# Patient Record
Sex: Female | Born: 1975 | Hispanic: No | Marital: Single | Smoking: Current every day smoker
Health system: Southern US, Community
[De-identification: ages and names within clinical notes are randomized; demographics above are authoritative.]

## PROBLEM LIST (undated history)

## (undated) ENCOUNTER — Emergency Department (HOSPITAL_COMMUNITY): Discharge status: Absent without leave | Payer: Self-pay | Source: Home / Self Care

## (undated) DIAGNOSIS — E079 Disorder of thyroid, unspecified: Secondary | ICD-10-CM

## (undated) DIAGNOSIS — F32A Depression, unspecified: Secondary | ICD-10-CM

## (undated) DIAGNOSIS — F329 Major depressive disorder, single episode, unspecified: Secondary | ICD-10-CM

## (undated) DIAGNOSIS — Z9884 Bariatric surgery status: Secondary | ICD-10-CM

## (undated) HISTORY — PX: TONSILLECTOMY: SUR1361

## (undated) HISTORY — PX: GASTRIC BYPASS: SHX52

---

## 2006-11-24 ENCOUNTER — Emergency Department (HOSPITAL_COMMUNITY): Admission: EM | Admit: 2006-11-24 | Discharge: 2006-11-24 | Payer: Self-pay | Admitting: Family Medicine

## 2007-04-29 ENCOUNTER — Emergency Department (HOSPITAL_COMMUNITY): Admission: EM | Admit: 2007-04-29 | Discharge: 2007-04-29 | Payer: Self-pay | Admitting: Emergency Medicine

## 2007-05-19 ENCOUNTER — Emergency Department (HOSPITAL_COMMUNITY): Admission: EM | Admit: 2007-05-19 | Discharge: 2007-05-19 | Payer: Self-pay | Admitting: Emergency Medicine

## 2008-05-21 ENCOUNTER — Emergency Department (HOSPITAL_COMMUNITY): Admission: EM | Admit: 2008-05-21 | Discharge: 2008-05-21 | Payer: Self-pay | Admitting: Family Medicine

## 2008-05-23 ENCOUNTER — Emergency Department (HOSPITAL_COMMUNITY): Admission: EM | Admit: 2008-05-23 | Discharge: 2008-05-23 | Payer: Self-pay | Admitting: Emergency Medicine

## 2009-02-12 ENCOUNTER — Emergency Department (HOSPITAL_COMMUNITY): Admission: EM | Admit: 2009-02-12 | Discharge: 2009-02-12 | Payer: Self-pay | Admitting: Family Medicine

## 2009-05-14 ENCOUNTER — Emergency Department (HOSPITAL_COMMUNITY): Admission: EM | Admit: 2009-05-14 | Discharge: 2009-05-14 | Payer: Self-pay | Admitting: Pediatric Allergy/Immunology

## 2010-08-10 ENCOUNTER — Emergency Department (HOSPITAL_COMMUNITY): Admission: EM | Admit: 2010-08-10 | Discharge: 2010-08-10 | Payer: Self-pay | Admitting: Emergency Medicine

## 2010-08-25 ENCOUNTER — Emergency Department (HOSPITAL_COMMUNITY): Admission: EM | Admit: 2010-08-25 | Discharge: 2010-08-25 | Payer: Self-pay | Admitting: Emergency Medicine

## 2010-11-21 ENCOUNTER — Emergency Department (HOSPITAL_COMMUNITY): Admission: EM | Admit: 2010-11-21 | Discharge: 2010-08-21 | Payer: Self-pay | Admitting: Emergency Medicine

## 2010-12-27 ENCOUNTER — Emergency Department (HOSPITAL_COMMUNITY)
Admission: EM | Admit: 2010-12-27 | Discharge: 2010-12-27 | Payer: Self-pay | Source: Home / Self Care | Admitting: Emergency Medicine

## 2010-12-30 LAB — CBC
HCT: 39.1 % (ref 36.0–46.0)
Hemoglobin: 13.5 g/dL (ref 12.0–15.0)
MCH: 32.4 pg (ref 26.0–34.0)
MCHC: 34.5 g/dL (ref 30.0–36.0)
MCV: 93.8 fL (ref 78.0–100.0)
Platelets: 333 10*3/uL (ref 150–400)
RBC: 4.17 MIL/uL (ref 3.87–5.11)
RDW: 12.4 % (ref 11.5–15.5)
WBC: 6.2 10*3/uL (ref 4.0–10.5)

## 2010-12-30 LAB — DIFFERENTIAL
Basophils Absolute: 0 10*3/uL (ref 0.0–0.1)
Basophils Relative: 1 % (ref 0–1)
Eosinophils Absolute: 0.1 10*3/uL (ref 0.0–0.7)
Eosinophils Relative: 1 % (ref 0–5)
Lymphocytes Relative: 30 % (ref 12–46)
Lymphs Abs: 1.9 10*3/uL (ref 0.7–4.0)
Monocytes Absolute: 0.3 10*3/uL (ref 0.1–1.0)
Monocytes Relative: 5 % (ref 3–12)
Neutro Abs: 3.9 10*3/uL (ref 1.7–7.7)
Neutrophils Relative %: 63 % (ref 43–77)

## 2010-12-30 LAB — COMPREHENSIVE METABOLIC PANEL
ALT: 16 U/L (ref 0–35)
AST: 24 U/L (ref 0–37)
Albumin: 4.3 g/dL (ref 3.5–5.2)
Alkaline Phosphatase: 68 U/L (ref 39–117)
BUN: 5 mg/dL — ABNORMAL LOW (ref 6–23)
CO2: 18 mEq/L — ABNORMAL LOW (ref 19–32)
Calcium: 9.6 mg/dL (ref 8.4–10.5)
Chloride: 113 mEq/L — ABNORMAL HIGH (ref 96–112)
Creatinine, Ser: 0.86 mg/dL (ref 0.4–1.2)
GFR calc Af Amer: >60 mL/min (ref >60)
GFR calc non Af Amer: >60 mL/min (ref >60)
Glucose, Bld: 118 mg/dL — ABNORMAL HIGH (ref 70–99)
Potassium: 3.8 mEq/L (ref 3.5–5.1)
Sodium: 139 mEq/L (ref 135–145)
Total Bilirubin: 1.7 mg/dL — ABNORMAL HIGH (ref 0.3–1.2)
Total Protein: 7.1 g/dL (ref 6.0–8.3)

## 2010-12-30 LAB — URINALYSIS, ROUTINE W REFLEX MICROSCOPIC
Ketones, ur: 15 mg/dL — AB
Nitrite: NEGATIVE
Protein, ur: 100 mg/dL — AB
Specific Gravity, Urine: >1.046 — ABNORMAL HIGH (ref 1.005–1.030)
Urine Glucose, Fasting: NEGATIVE mg/dL
Urobilinogen, UA: 0.2 mg/dL (ref 0.0–1.0)
pH: 5.5 (ref 5.0–8.0)

## 2010-12-30 LAB — URINE MICROSCOPIC-ADD ON

## 2010-12-30 LAB — LIPASE, BLOOD: Lipase: 20 U/L (ref 11–59)

## 2011-01-20 ENCOUNTER — Emergency Department (HOSPITAL_COMMUNITY)
Admission: EM | Admit: 2011-01-20 | Discharge: 2011-01-20 | Disposition: A | Discharge status: Home / Self Care | Payer: Self-pay | Attending: Emergency Medicine | Admitting: Emergency Medicine

## 2011-01-20 DIAGNOSIS — H109 Unspecified conjunctivitis: Secondary | ICD-10-CM | POA: Insufficient documentation

## 2011-02-27 LAB — CBC
HCT: 39.2 % (ref 36.0–46.0)
HCT: 42.1 % (ref 36.0–46.0)
Hemoglobin: 13.5 g/dL (ref 12.0–15.0)
Hemoglobin: 14.8 g/dL (ref 12.0–15.0)
MCH: 31.8 pg (ref 26.0–34.0)
MCHC: 34.5 g/dL (ref 30.0–36.0)
MCHC: 35.1 g/dL (ref 30.0–36.0)
MCV: 92.1 fL (ref 78.0–100.0)
Platelets: 255 10*3/uL (ref 150–400)
RBC: 4.25 MIL/uL (ref 3.87–5.11)
RDW: 14.1 % (ref 11.5–15.5)
WBC: 9.1 10*3/uL (ref 4.0–10.5)

## 2011-02-27 LAB — URINALYSIS, ROUTINE W REFLEX MICROSCOPIC
Bilirubin Urine: NEGATIVE
Glucose, UA: NEGATIVE mg/dL
Leukocytes, UA: NEGATIVE
Nitrite: NEGATIVE
Protein, ur: NEGATIVE mg/dL
Specific Gravity, Urine: 1.034 — ABNORMAL HIGH (ref 1.005–1.030)
Urobilinogen, UA: 0.2 mg/dL (ref 0.0–1.0)
pH: 6 (ref 5.0–8.0)

## 2011-02-27 LAB — DIFFERENTIAL
Basophils Absolute: 0 10*3/uL (ref 0.0–0.1)
Basophils Relative: 0 % (ref 0–1)
Basophils Relative: 1 % (ref 0–1)
Eosinophils Absolute: 0.4 10*3/uL (ref 0.0–0.7)
Eosinophils Relative: 4 % (ref 0–5)
Lymphocytes Relative: 44 % (ref 12–46)
Lymphs Abs: 4 10*3/uL (ref 0.7–4.0)
Monocytes Absolute: 0.5 10*3/uL (ref 0.1–1.0)
Monocytes Absolute: 0.7 10*3/uL (ref 0.1–1.0)
Monocytes Relative: 7 % (ref 3–12)
Monocytes Relative: 7 % (ref 3–12)
Neutro Abs: 3.3 10*3/uL (ref 1.7–7.7)
Neutro Abs: 4 10*3/uL (ref 1.7–7.7)
Neutrophils Relative %: 45 % (ref 43–77)

## 2011-02-27 LAB — COMPREHENSIVE METABOLIC PANEL
ALT: 19 U/L (ref 0–35)
AST: 23 U/L (ref 0–37)
Albumin: 4.2 g/dL (ref 3.5–5.2)
Alkaline Phosphatase: 80 U/L (ref 39–117)
BUN: 10 mg/dL (ref 6–23)
CO2: 24 mEq/L (ref 19–32)
Calcium: 9 mg/dL (ref 8.4–10.5)
Chloride: 110 mEq/L (ref 96–112)
Creatinine, Ser: 0.86 mg/dL (ref 0.4–1.2)
GFR calc Af Amer: >60 mL/min (ref >60)
GFR calc non Af Amer: >60 mL/min (ref >60)
Glucose, Bld: 197 mg/dL — ABNORMAL HIGH (ref 70–99)
Potassium: 3.7 mEq/L (ref 3.5–5.1)
Sodium: 139 mEq/L (ref 135–145)
Total Bilirubin: 1 mg/dL (ref 0.3–1.2)
Total Protein: 6.6 g/dL (ref 6.0–8.3)

## 2011-02-27 LAB — URINE MICROSCOPIC-ADD ON

## 2011-02-27 LAB — BASIC METABOLIC PANEL
BUN: 9 mg/dL (ref 6–23)
CO2: 24 mEq/L (ref 19–32)
Calcium: 9.7 mg/dL (ref 8.4–10.5)
Chloride: 107 mEq/L (ref 96–112)
Creatinine, Ser: 0.91 mg/dL (ref 0.4–1.2)
GFR calc Af Amer: >60 mL/min (ref >60)
GFR calc non Af Amer: >60 mL/min (ref >60)
Glucose, Bld: 120 mg/dL — ABNORMAL HIGH (ref 70–99)
Potassium: 3.5 mEq/L (ref 3.5–5.1)
Sodium: 138 mEq/L (ref 135–145)

## 2011-02-27 LAB — POCT CARDIAC MARKERS
CKMB, poc: <1 ng/mL — ABNORMAL LOW (ref 1.0–8.0)
Myoglobin, poc: 40.1 ng/mL (ref 12–200)
Troponin i, poc: <0.05 ng/mL (ref 0.00–0.09)

## 2011-02-27 LAB — LIPASE, BLOOD: Lipase: 45 U/L (ref 11–59)

## 2011-02-27 LAB — POCT PREGNANCY, URINE: Preg Test, Ur: NEGATIVE

## 2011-02-28 LAB — DIFFERENTIAL
Lymphocytes Relative: 37 % (ref 12–46)
Lymphs Abs: 3.7 10*3/uL (ref 0.7–4.0)
Neutro Abs: 5.3 10*3/uL (ref 1.7–7.7)
Neutrophils Relative %: 53 % (ref 43–77)

## 2011-02-28 LAB — CBC
MCV: 91.7 fL (ref 78.0–100.0)
Platelets: 278 10*3/uL (ref 150–400)
RBC: 4.4 MIL/uL (ref 3.87–5.11)
WBC: 10 10*3/uL (ref 4.0–10.5)

## 2011-02-28 LAB — POCT I-STAT, CHEM 8
BUN: 6 mg/dL (ref 6–23)
Chloride: 107 mEq/L (ref 96–112)
Creatinine, Ser: 1.1 mg/dL (ref 0.4–1.2)
Potassium: 3.1 mEq/L — ABNORMAL LOW (ref 3.5–5.1)
Sodium: 141 mEq/L (ref 135–145)

## 2011-03-23 ENCOUNTER — Inpatient Hospital Stay (HOSPITAL_COMMUNITY)
Admission: EM | Admit: 2011-03-23 | Discharge: 2011-03-27 | DRG: 918 | Disposition: A | Discharge status: Home / Self Care | Payer: Self-pay | Attending: Internal Medicine | Admitting: Internal Medicine

## 2011-03-23 DIAGNOSIS — F1010 Alcohol abuse, uncomplicated: Secondary | ICD-10-CM | POA: Diagnosis present

## 2011-03-23 DIAGNOSIS — Z9884 Bariatric surgery status: Secondary | ICD-10-CM

## 2011-03-23 DIAGNOSIS — Y92009 Unspecified place in unspecified non-institutional (private) residence as the place of occurrence of the external cause: Secondary | ICD-10-CM

## 2011-03-23 DIAGNOSIS — T391X1A Poisoning by 4-Aminophenol derivatives, accidental (unintentional), initial encounter: Principal | ICD-10-CM | POA: Diagnosis present

## 2011-03-23 DIAGNOSIS — R112 Nausea with vomiting, unspecified: Secondary | ICD-10-CM | POA: Diagnosis present

## 2011-03-23 DIAGNOSIS — E871 Hypo-osmolality and hyponatremia: Secondary | ICD-10-CM | POA: Diagnosis present

## 2011-03-23 DIAGNOSIS — R197 Diarrhea, unspecified: Secondary | ICD-10-CM | POA: Diagnosis present

## 2011-03-23 DIAGNOSIS — F17200 Nicotine dependence, unspecified, uncomplicated: Secondary | ICD-10-CM | POA: Diagnosis present

## 2011-03-23 DIAGNOSIS — F1410 Cocaine abuse, uncomplicated: Secondary | ICD-10-CM | POA: Diagnosis present

## 2011-03-24 ENCOUNTER — Emergency Department (HOSPITAL_COMMUNITY): Payer: Self-pay

## 2011-03-24 LAB — COMPREHENSIVE METABOLIC PANEL
ALT: 2454 U/L — ABNORMAL HIGH (ref 0–35)
ALT: 1821 U/L — ABNORMAL HIGH (ref 0–35)
AST: 3398 U/L — ABNORMAL HIGH (ref 0–37)
Albumin: 4.1 g/dL (ref 3.5–5.2)
Alkaline Phosphatase: 64 U/L (ref 39–117)
BUN: 5 mg/dL — ABNORMAL LOW (ref 6–23)
CO2: 20 mEq/L (ref 19–32)
CO2: 22 mEq/L (ref 19–32)
Calcium: 8.8 mg/dL (ref 8.4–10.5)
Chloride: 108 mEq/L (ref 96–112)
Creatinine, Ser: 0.71 mg/dL (ref 0.4–1.2)
GFR calc Af Amer: >60 mL/min (ref >60)
GFR calc non Af Amer: >60 mL/min (ref >60)
Glucose, Bld: 106 mg/dL — ABNORMAL HIGH (ref 70–99)
Potassium: 4.1 mEq/L (ref 3.5–5.1)
Sodium: 133 mEq/L — ABNORMAL LOW (ref 135–145)
Sodium: 135 mEq/L (ref 135–145)
Total Bilirubin: 1 mg/dL (ref 0.3–1.2)
Total Protein: 6.9 g/dL (ref 6.0–8.3)
Total Protein: 6.6 g/dL (ref 6.0–8.3)

## 2011-03-24 LAB — DIFFERENTIAL
Basophils Absolute: 0 10*3/uL (ref 0.0–0.1)
Basophils Relative: 1 % (ref 0–1)
Eosinophils Absolute: 0.2 10*3/uL (ref 0.0–0.7)
Monocytes Absolute: 0.2 10*3/uL (ref 0.1–1.0)
Neutro Abs: 4.3 10*3/uL (ref 1.7–7.7)
Neutrophils Relative %: 74 % (ref 43–77)

## 2011-03-24 LAB — URINE MICROSCOPIC-ADD ON

## 2011-03-24 LAB — URINALYSIS, ROUTINE W REFLEX MICROSCOPIC
Glucose, UA: NEGATIVE mg/dL
Hgb urine dipstick: NEGATIVE
Leukocytes, UA: NEGATIVE
Specific Gravity, Urine: >1.046 — ABNORMAL HIGH (ref 1.005–1.030)
Urobilinogen, UA: 1 mg/dL (ref 0.0–1.0)

## 2011-03-24 LAB — PROTIME-INR
INR: 0.96 (ref 0.00–1.49)
INR: 0.95 (ref 0.00–1.49)
Prothrombin Time: 13 seconds (ref 11.6–15.2)
Prothrombin Time: 12.9 seconds (ref 11.6–15.2)

## 2011-03-24 LAB — RAPID URINE DRUG SCREEN, HOSP PERFORMED
Barbiturates: NOT DETECTED
Opiates: NOT DETECTED

## 2011-03-24 LAB — CBC
Hemoglobin: 14.6 g/dL (ref 12.0–15.0)
MCHC: 36 g/dL (ref 30.0–36.0)
Platelets: 246 10*3/uL (ref 150–400)

## 2011-03-24 LAB — ACETAMINOPHEN LEVEL: Acetaminophen (Tylenol), Serum: <10 ug/mL — ABNORMAL LOW (ref 10–30)

## 2011-03-24 LAB — SALICYLATE LEVEL: Salicylate Lvl: <4 mg/dL (ref 2.8–20.0)

## 2011-03-25 LAB — LIPASE, BLOOD: Lipase: 27 U/L (ref 11–59)

## 2011-03-25 LAB — COMPREHENSIVE METABOLIC PANEL
ALT: 1154 U/L — ABNORMAL HIGH (ref 0–35)
AST: 406 U/L — ABNORMAL HIGH (ref 0–37)
BUN: 3 mg/dL — ABNORMAL LOW (ref 6–23)
CO2: 25 mEq/L (ref 19–32)
CO2: 27 mEq/L (ref 19–32)
CO2: 24 mEq/L (ref 19–32)
Calcium: 8.7 mg/dL (ref 8.4–10.5)
Calcium: 9 mg/dL (ref 8.4–10.5)
Calcium: 8.5 mg/dL (ref 8.4–10.5)
Chloride: 108 mEq/L (ref 96–112)
Chloride: 109 mEq/L (ref 96–112)
Creatinine, Ser: 0.66 mg/dL (ref 0.4–1.2)
Creatinine, Ser: 0.59 mg/dL (ref 0.4–1.2)
Creatinine, Ser: 0.6 mg/dL (ref 0.4–1.2)
GFR calc Af Amer: >60 mL/min (ref >60)
GFR calc non Af Amer: >60 mL/min (ref >60)
GFR calc non Af Amer: >60 mL/min (ref >60)
GFR calc non Af Amer: >60 mL/min (ref >60)
Glucose, Bld: 135 mg/dL — ABNORMAL HIGH (ref 70–99)
Glucose, Bld: 87 mg/dL (ref 70–99)
Glucose, Bld: 81 mg/dL (ref 70–99)
Sodium: 141 mEq/L (ref 135–145)
Total Bilirubin: 0.7 mg/dL (ref 0.3–1.2)
Total Bilirubin: 0.6 mg/dL (ref 0.3–1.2)
Total Protein: 6.1 g/dL (ref 6.0–8.3)

## 2011-03-25 LAB — URINALYSIS, MICROSCOPIC ONLY
Bilirubin Urine: NEGATIVE
Hgb urine dipstick: NEGATIVE
Ketones, ur: NEGATIVE mg/dL
Nitrite: NEGATIVE
Specific Gravity, Urine: 1.011 (ref 1.005–1.030)
Urobilinogen, UA: 1 mg/dL (ref 0.0–1.0)

## 2011-03-25 LAB — POCT I-STAT, CHEM 8
BUN: 7 mg/dL (ref 6–23)
Calcium, Ion: 1.27 mmol/L (ref 1.12–1.32)
Chloride: 107 mEq/L (ref 96–112)
Glucose, Bld: 102 mg/dL — ABNORMAL HIGH (ref 70–99)
TCO2: 23 mmol/L (ref 0–100)

## 2011-03-25 LAB — RAPID URINE DRUG SCREEN, HOSP PERFORMED
Amphetamines: NOT DETECTED
Barbiturates: NOT DETECTED
Benzodiazepines: NOT DETECTED
Cocaine: NOT DETECTED

## 2011-03-25 LAB — HEPATITIS PANEL, ACUTE
Hep A IgM: NEGATIVE
Hep B C IgM: NEGATIVE

## 2011-03-25 LAB — PROTIME-INR
INR: 0.9 (ref 0.00–1.49)
Prothrombin Time: 12.4 seconds (ref 11.6–15.2)

## 2011-03-26 LAB — COMPREHENSIVE METABOLIC PANEL
AST: 120 U/L — ABNORMAL HIGH (ref 0–37)
Albumin: 3.5 g/dL (ref 3.5–5.2)
BUN: 2 mg/dL — ABNORMAL LOW (ref 6–23)
Calcium: 8.9 mg/dL (ref 8.4–10.5)
Chloride: 103 mEq/L (ref 96–112)
Creatinine, Ser: 0.53 mg/dL (ref 0.4–1.2)
GFR calc Af Amer: >60 mL/min (ref >60)
Total Bilirubin: 0.6 mg/dL (ref 0.3–1.2)

## 2011-03-26 LAB — PROTIME-INR
INR: 0.93 (ref 0.00–1.49)
Prothrombin Time: 12.7 seconds (ref 11.6–15.2)

## 2011-03-26 LAB — ACETAMINOPHEN LEVEL: Acetaminophen (Tylenol), Serum: <10 ug/mL — ABNORMAL LOW (ref 10–30)

## 2011-03-27 LAB — COMPREHENSIVE METABOLIC PANEL
ALT: 561 U/L — ABNORMAL HIGH (ref 0–35)
Albumin: 3.3 g/dL — ABNORMAL LOW (ref 3.5–5.2)
Alkaline Phosphatase: 72 U/L (ref 39–117)
Alkaline Phosphatase: 71 U/L (ref 39–117)
BUN: 4 mg/dL — ABNORMAL LOW (ref 6–23)
Chloride: 104 mEq/L (ref 96–112)
Chloride: 102 mEq/L (ref 96–112)
Creatinine, Ser: 0.58 mg/dL (ref 0.4–1.2)
Glucose, Bld: 83 mg/dL (ref 70–99)
Glucose, Bld: 102 mg/dL — ABNORMAL HIGH (ref 70–99)
Potassium: 4.8 mEq/L (ref 3.5–5.1)
Potassium: 4.1 mEq/L (ref 3.5–5.1)
Sodium: 138 mEq/L (ref 135–145)
Total Bilirubin: 0.5 mg/dL (ref 0.3–1.2)
Total Bilirubin: 0.6 mg/dL (ref 0.3–1.2)
Total Protein: 6.2 g/dL (ref 6.0–8.3)

## 2011-03-27 LAB — GLUCOSE, CAPILLARY: Glucose-Capillary: 96 mg/dL (ref 70–99)

## 2011-03-27 LAB — PROTIME-INR: Prothrombin Time: 12.9 seconds (ref 11.6–15.2)

## 2011-03-28 NOTE — Discharge Summary (Signed)
NAME:  [name], [name]            ACCOUNT NO.:  [id number]  MEDICAL RECORD NO.:  [id number]           PATIENT TYPE:  I  LOCATION:  6714                         FACILITY:  [facility]  PHYSICIAN:  [name], M.D.DATE OF BIRTH:  Jun 16, 1976  DATE OF ADMISSION:  03/23/2011 DATE OF DISCHARGE:  03/27/2011                              DISCHARGE SUMMARY   PRIMARY CARE DOCTOR:  Unassigned.  DISCHARGE DIAGNOSES: 1. Accidental acetaminophen overdose. 2. Diarrhea. 3. Polysubstance abuse. 4. Hyponatremia.  DISCHARGE MEDICATIONS: 1. Protonix 40 mg. 2. Tramadol 50 mg 2 tablets q. 8 hours p.r.n. 3. Ferrous sulfate over the counter p.o. daily.  PROCEDURES PERFORMED:  Abdominal ultrasound that showed mildly distended gallbladder without evidence for obstruction or cholelithiasis, findings compatible with his prior CT mild fatty liver infiltration.  BRIEF ADMITTING HISTORY AND PHYSICAL:  This is a 35 year old female with past medical history of nausea and vomiting and persisting epigastric abdominal pain had been going since Thursday.  She had a bypass Roux-en- Y procedure already.  Every time she eats anything, she has increased epigastric or right upper quadrant pain with nausea.  She has only had 2 episodes of diarrhea, throughout all this process her stool is otherwise normal.  The pain is central, is located in the epigastric area.  She denies any melena or bright blood per rectum.  She was found to have in the ED an elevated LFTs.  Denies any alcohol use, but admits to taking Tylenol very frequently and not intentionally, but try to relieve the pain.  PHYSICAL EXAMINATION:  VITAL SIGNS:  Temperature 98, blood pressure 110/98, pulse 68, respirations of 20, she is saturating 98% on room air. GENERAL:  She is awake, alert, and oriented x3, in no acute distress. HEENT:  Pupils equally round, and reactive to light. NECK:  Supple.  No JVD. LUNGS:  Good air movement.  Clear to  auscultation. CARDIOVASCULAR:  She has regular rate and rhythm with positive S1 and S2.  No murmurs, rubs or gallops. ABDOMEN:  Positive bowel sounds, nontender, nondistended.  Soft. EXTREMITIES:  Positive pulses.  No clubbing, cyanosis or edema.  LABS ON ADMISSION:  Showed UA 1040 of specific gravity, crystal oxalate, rare bacteria.  Her white count is 5.8, hemoglobin of 14.6, platelet count of 246, ANC 4.3, MCV of 94.  Her lipase was 29.  Her urine pregnancy test was negative.  Her sodium was 133, potassium 3.8, chloride 107, bicarb 20, glucose of 111, BUN of 7, creatinine 0.7, bilirubin 1.3, alkaline phosphatase 73, AST 3398, ALT 2458, total protein 6.9, albumin of 4.1, calcium of 8.2.  Acetaminophen level less than 10.  Aspirin less than 4.  Urine drug screen is positive for cocaine.  PT 13.  INR 0.9.  IMAGING:  As above.  ASSESSMENT/PLAN: 1. Accidental acetaminophen overdose.  This was nonintentional, she     was trying to relieve her pain, so she was taking continuous     tylenol.  She had  been drinking alcohol, which may be     contributing to this.  She was started on NAC which she completed a     3-day treatment.  PT and INR and daily acetaminophens were done.     They were also remained, acetaminophen less than 10 and alcohol     less than 5, her PT/INR remained her PT less than 13 and her INR     less than 1.  After 1 day of treatment of mag, her LFTs started to     trend down.  She was feeling much better.  She was on started on     Protonix p.o. b.i.d.  Abdominal pain improved, so she would going     home on Protonix p.o. b.i.d.  Avoid Tylenol.  We will use Tylenol     for pain. 2. Diarrhea, currently resolved.  No further evaluations. 3. Polysubstance abuse.  She was counseled about cocaine and about     alcohol, as this might affect her kidney and liver in the future.     She seems to understand. 4. Hyponatremia.  This probably secondary to dehydration.  She was      started on IV fluids and this resolved.  VITALS ON DAY OF DISCHARGE:  Showed temperature 98, pulse 70, respiration 18, blood pressure 119/82.  She was saturating 98% on room air.  LABS ON DAY OF DISCHARGE:  Showed sodium 137, potassium 4.1, chloride 102, bicarb of 29, glucose of 102, BUN of 4, creatinine 0.5, bilirubin 0.6, alkaline phosphatase 71, AST 61, ALT 503,  total protein 6.3, albumin of 3.9, calcium of 8.9.     [name], M.D.     AF/MEDQ  D:  03/27/2011  T:  03/28/2011  Job:  [id number]  Electronically Signed by Lambert Keto M.D. on 03/28/2011 02:55:47 PM

## 2011-04-01 NOTE — H&P (Signed)
[name], [name]            ACCOUNT NO.:  [id number]  MEDICAL RECORD NO.:  [id number]           PATIENT TYPE:  I  LOCATION:  6714                         FACILITY:  [facility]  PHYSICIAN:  Lonia Blood, M.D.      DATE OF BIRTH:  May 16, 1976  DATE OF ADMISSION:  03/23/2011 DATE OF DISCHARGE:                             HISTORY & PHYSICAL   PRIMARY CARE PHYSICIAN:  She is unassigned to [place].  PRESENTING COMPLAINT:  Nausea, vomiting, abdominal pain.  HISTORY OF PRESENT ILLNESS:  The patient is a 35 year old female that presented to the ED with a couple of days of nausea, vomiting and persistent epigastric abdominal pain.  This has been going on since Thursday.  She has gastric bypass surgery in 2011 over at [place].  She was a roux-en-Y procedure, but lately every time she eats or drinks anything, she has had increased epigastric or right upper quadrant abdominal pain and nausea.  She has had only two episodes of diarrhea through all this process.  Her stools is otherwise normal.  The pain is central, is located in the epigastric region and rated as 6-7/10.  She denied any melena.  No bright red blood per rectum.  No hematemesis. The patient also volunteered information that she has been using about 15 g of Tylenol a day.  This is all effort to control her pain and this has been going on for several days if not almost 2 weeks.  She was found in the ED to have elevated LFTs.  Denied any alcoholism.  Denied taking other drugs.  PAST MEDICAL HISTORY:  None.  PAST SURGICAL HISTORY:  She is status post gastric bypass surgery since November 2011.  ALLERGIES:  She has no known drug allergies.  MEDICATIONS:  Currently none except for the TYLENOL that she takes on and off.  SOCIAL HISTORY:  The patient lives in [place].  She denied any alcohol use.  She smokes about a half pack per day.  She is otherwise normal.  FAMILY HISTORY:  Denied any significant family history.  REVIEW OF  SYSTEMS:  All systems reviewed currently are negative except per HPI.  PHYSICAL EXAMINATION:  VITAL SIGNS:  Her temperature is 98.2, blood pressure 110/95, pulse 68, respiratory rate 20, her sats 98% room air. GENERAL:  She is awake, alert, oriented.  She seems to be mild distress due to pain. HEENT:  PERRL.  EOMI.  No pallor.  No jaundice.  No rhinorrhea. NECK:  Supple.  No JVD.  No lymphadenopathy. RESPIRATORY:  She has good air entry bilaterally.  No wheezes.  No rales.  No crackles. CARDIOVASCULAR:  She has S1, S2.  No murmur. ABDOMEN:  Soft and nontender with positive bowel sounds. EXTREMITIES:  No edema, cyanosis or clubbing. SKIN:  No rashes or ulcers.  LABORATORY DATA:  Her urinalysis showed amber cloudy urine, which is concentrated with pH with urine specific gravity more than 1.046 and mild proteinuria.  Urine microscopy showed calcium oxalate crystals only.  White count 5.8, hemoglobin 14.6 with platelet of 246.  Her sodium is 133, potassium 3.8, chloride 107, CO2 20, glucose 111, BUN 7, creatinine  0.71, calcium 8.8, total protein 6.9.  Her albumin is 4.1. Her AST is 3398, total bilirubin 1.8, alkaline phosphatase 73.  Her ALT is 2454, lipase is 29.  Abdominal ultrasound showed mildly distended gallbladder without evidence for obstruction or cholecystitis.  Findings stable from the prior CT, mild fatty infiltration within the liver.  ASSESSMENT:  This is a 35 year old female presenting with elevated LFTs and epigastric pain.  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  It could also be viral hepatitis.  Denied alcoholism but could be alcoholic hepatitis.  He is not consistent with obstructive jaundice, so I doubt this is gallstone related.  She also has no evidence of cholecystitis.  PLAN: 1. Abnormal liver enzymes.  We will admit the patient and assume     Tylenol-induce injury.   We will treat her for that and follow her     LFTs closely.  We may have to get GI consult as well. 2. Possible Tylenol toxicity.  I will check Tylenol levels, salicylate     levels on the urine drug screen.  We have contacted poison control     and advice is to cautiously start n-acetylcysteine for the first 24     hours and recheck her LFTs after about 23 hours to see if they are     improving.  We will also check PT/INR at this stage. 3. Tobacco abuse.  The patient will get tobacco cessation counseling.     I will offer her nicotine patch as needed. 4. Hyponatremia probably from being mildly dehydrated.  We will give     her some saline and watch closely.     Lonia Blood, M.D.     Verlin Grills  D:  03/24/2011  T:  03/24/2011  Job:  [id number]  Electronically Signed by Lonia Blood M.D. on 04/01/2011 04:14:56 PM

## 2011-05-23 ENCOUNTER — Emergency Department (HOSPITAL_COMMUNITY): Payer: Self-pay

## 2011-05-23 ENCOUNTER — Emergency Department (HOSPITAL_COMMUNITY)
Admission: EM | Admit: 2011-05-23 | Discharge: 2011-05-24 | Disposition: A | Discharge status: Home / Self Care | Payer: Self-pay | Attending: Emergency Medicine | Admitting: Emergency Medicine

## 2011-05-23 DIAGNOSIS — Z9884 Bariatric surgery status: Secondary | ICD-10-CM | POA: Insufficient documentation

## 2011-05-23 DIAGNOSIS — R109 Unspecified abdominal pain: Secondary | ICD-10-CM | POA: Insufficient documentation

## 2011-05-23 HISTORY — DX: Bariatric surgery status: Z98.84

## 2011-05-23 LAB — POCT PREGNANCY, URINE: Preg Test, Ur: NEGATIVE

## 2011-05-23 LAB — CBC
HCT: 39.4 % (ref 36.0–46.0)
Hemoglobin: 14 g/dL (ref 12.0–15.0)
MCHC: 35.5 g/dL (ref 30.0–36.0)

## 2011-05-23 LAB — DIFFERENTIAL
Basophils Absolute: 0.1 10*3/uL (ref 0.0–0.1)
Lymphocytes Relative: 35 % (ref 12–46)
Lymphs Abs: 2.9 10*3/uL (ref 0.7–4.0)
Monocytes Absolute: 0.5 10*3/uL (ref 0.1–1.0)
Monocytes Relative: 6 % (ref 3–12)
Neutro Abs: 4.8 10*3/uL (ref 1.7–7.7)

## 2011-05-23 LAB — COMPREHENSIVE METABOLIC PANEL
Alkaline Phosphatase: 78 U/L (ref 39–117)
BUN: 12 mg/dL (ref 6–23)
GFR calc Af Amer: >60 mL/min (ref >60)
Glucose, Bld: 100 mg/dL — ABNORMAL HIGH (ref 70–99)
Potassium: 4.1 mEq/L (ref 3.5–5.1)
Total Bilirubin: 0.7 mg/dL (ref 0.3–1.2)
Total Protein: 7.3 g/dL (ref 6.0–8.3)

## 2011-05-23 LAB — URINALYSIS, ROUTINE W REFLEX MICROSCOPIC
Bilirubin Urine: NEGATIVE
Ketones, ur: NEGATIVE mg/dL
Nitrite: NEGATIVE
Protein, ur: NEGATIVE mg/dL
Urobilinogen, UA: 0.2 mg/dL (ref 0.0–1.0)

## 2011-05-23 LAB — LIPASE, BLOOD: Lipase: 33 U/L (ref 11–59)

## 2011-05-24 ENCOUNTER — Encounter (HOSPITAL_COMMUNITY): Payer: Self-pay | Admitting: Radiology

## 2011-05-24 LAB — HEPATIC FUNCTION PANEL
AST: 22 U/L (ref 0–37)
Bilirubin, Direct: 0.1 mg/dL (ref 0.0–0.3)
Indirect Bilirubin: 0.5 mg/dL (ref 0.3–0.9)

## 2011-05-24 MED ORDER — IOHEXOL 300 MG/ML  SOLN
100.0000 mL | Freq: Once | INTRAMUSCULAR | Status: AC | PRN
  Administered 2011-05-24: 100 mL via INTRAVENOUS

## 2011-08-18 ENCOUNTER — Emergency Department (HOSPITAL_COMMUNITY): Payer: Self-pay

## 2011-08-18 ENCOUNTER — Emergency Department (HOSPITAL_COMMUNITY)
Admission: EM | Admit: 2011-08-18 | Discharge: 2011-08-18 | Disposition: A | Discharge status: Home / Self Care | Payer: Self-pay | Attending: Emergency Medicine | Admitting: Emergency Medicine

## 2011-08-18 DIAGNOSIS — S6390XA Sprain of unspecified part of unspecified wrist and hand, initial encounter: Secondary | ICD-10-CM | POA: Insufficient documentation

## 2011-08-18 DIAGNOSIS — M25539 Pain in unspecified wrist: Secondary | ICD-10-CM | POA: Insufficient documentation

## 2011-08-18 DIAGNOSIS — IMO0002 Reserved for concepts with insufficient information to code with codable children: Secondary | ICD-10-CM | POA: Insufficient documentation

## 2011-09-11 LAB — POCT RAPID STREP A: Streptococcus, Group A Screen (Direct): POSITIVE — AB

## 2012-02-26 ENCOUNTER — Encounter (HOSPITAL_COMMUNITY): Payer: Self-pay | Admitting: *Deleted

## 2012-02-26 ENCOUNTER — Emergency Department (INDEPENDENT_AMBULATORY_CARE_PROVIDER_SITE_OTHER)
Admission: EM | Admit: 2012-02-26 | Discharge: 2012-02-26 | Disposition: A | Discharge status: Home / Self Care | Payer: Self-pay | Source: Home / Self Care | Attending: Family Medicine | Admitting: Family Medicine

## 2012-02-26 ENCOUNTER — Emergency Department (HOSPITAL_COMMUNITY)
Admission: EM | Admit: 2012-02-26 | Discharge: 2012-02-26 | Discharge status: Against Medical Advice | Payer: Self-pay | Attending: Emergency Medicine | Admitting: Emergency Medicine

## 2012-02-26 DIAGNOSIS — J029 Acute pharyngitis, unspecified: Secondary | ICD-10-CM

## 2012-02-26 DIAGNOSIS — K219 Gastro-esophageal reflux disease without esophagitis: Secondary | ICD-10-CM | POA: Insufficient documentation

## 2012-02-26 MED ORDER — MAGIC MOUTHWASH W/LIDOCAINE: 5.0000 mL | Freq: Three times a day (TID) | ORAL | Status: DC | PRN

## 2012-02-26 NOTE — ED Notes (Signed)
States has had a sore throat x 4 days and can hardly swallow. States all joint in body are painful.  States taking a lot of tylenol for 3 weeks straight - states taking 10 500 mg tylenol in 24 hours every other day for 3 weeks.. States has been admitted to hospital for taking too much tylenol.  States cold isn't going away.

## 2012-02-26 NOTE — Discharge Instructions (Signed)
This is likely viral pharyngitis. Use the prescribed mouthwash for symptom relief. Continue supportive care with ibuprofen (Motrin, Advil) and/or acetaminophen (Tylenol) for fever control and control of pain and inflammation. You may alternate these every 4 hours; for example, if you take acetaminophen at noon, then you can take ibuprofen at 4 pm, then acetaminophen again at 8 pm, etc. Also, encourage aggressive hydration. Return to care should your symptoms not improve, or worsen in any way.

## 2012-02-26 NOTE — ED Provider Notes (Signed)
History     CSN: [id number]  Arrival date & time 02/26/12  Paulo Fruit   First MD Initiated Contact with Patient 02/26/12 1838      Chief Complaint  Patient presents with  . Sore Throat    (Consider location/radiation/quality/duration/timing/severity/associated sxs/prior treatment) HPI Comments: [name] presents for evaluation of 4 days of sore throat. She also presents for 3 weeks of URI symptoms. She reports intermittent fevers, sinus congestion, rhinorrhea, and fatigue. She is taking Tylenol up to 5000 mg every other day. She is a previous admission to the hospital for acetaminophen overdose, as well. She also reports taking up to thousand milligrams of ibuprofen 3 times a day as well.  Patient is a 36 y.o. female presenting with pharyngitis.  Sore Throat This is a new problem. The current episode started more than 2 days ago. The problem occurs constantly. The problem has not changed since onset.The symptoms are aggravated by nothing. The symptoms are relieved by nothing. She has tried acetaminophen for the symptoms.    Past Medical History  Diagnosis Date  . History of gastric bypass     Past Surgical History  Procedure Date  . Tonsillectomy   . Gastric bypass     Family History  Problem Relation Age of Onset  . Diabetes Father     History  Substance Use Topics  . Smoking status: Current Everyday Smoker  . Smokeless tobacco: Not on file  . Alcohol Use: Yes    OB History    Grav Para Term Preterm Abortions TAB SAB Ect Mult Living                  Review of Systems  Constitutional: Positive for fever.  HENT: Positive for congestion, sore throat, rhinorrhea and trouble swallowing.   Eyes: Negative.   Respiratory: Negative.   Cardiovascular: Negative.   Gastrointestinal: Negative.   Genitourinary: Negative.   Musculoskeletal: Negative.   Skin: Negative.   Neurological: Negative.     Allergies  Review of patient's allergies indicates no known  allergies.  Home Medications   Current Outpatient Rx  Name Route Sig Dispense Refill  . ACETAMINOPHEN 500 MG PO TABS Oral Take 500 mg by mouth every 6 (six) hours as needed.    Marland Kitchen CALCIUM CARBONATE 1250 MG PO CAPS Oral Take 1,250 mg by mouth every morning.    Marland Kitchen VITAMIN B-12 250 MCG PO TABS Oral Take 250 mcg by mouth daily.    Marland Kitchen MAGIC MOUTHWASH W/LIDOCAINE Oral Take 5 mLs by mouth 3 (three) times daily as needed. 100 mL 0    BP 131/80  Pulse 86  Temp(Src) 98.4 F (36.9 C) (Oral)  Resp 16  SpO2 98%  LMP 02/12/2012  Physical Exam  Nursing note and vitals reviewed. Constitutional: She is oriented to person, place, and time. She appears well-developed and well-nourished.  HENT:  Head: Normocephalic and atraumatic.  Right Ear: Tympanic membrane normal.  Left Ear: Tympanic membrane normal.  Mouth/Throat: Uvula is midline, oropharynx is clear and moist and mucous membranes are normal.  Eyes: EOM are normal.  Neck: Normal range of motion.  Pulmonary/Chest: Effort normal and breath sounds normal. She has no decreased breath sounds. She has no wheezes. She has no rhonchi.  Musculoskeletal: Normal range of motion.  Neurological: She is alert and oriented to person, place, and time.  Skin: Skin is warm and dry.  Psychiatric: Her behavior is normal.    ED Course  Procedures (including critical care time)  Labs Reviewed -  No data to display No results found.   1. Pharyngitis       MDM  rx given for magic mouthwash; supportive care        Renaee Munda, MD 02/26/12 2051

## 2012-02-26 NOTE — ED Notes (Signed)
Pt reports she has been sick since feb and states that for the last week has had a sore throat. Pt reports fevers. States she has been taking tylenol a lot. Pt reports hx of acid reflux and has not had any relief from OTC meds.

## 2012-06-07 IMAGING — CT CT ABD-PELV W/ CM
2 of 4 series · 14 of 32 positions shown, 19 images · IV contrast (agent unspecified)
Comparison: Chest and two views abdomen 08/25/2010 and CT abdomen
and pelvis 08/21/2010.

CLINICAL DATA: Abdominal pain.  History of gastric bypass.

CT ABDOMEN AND PELVIS WITH CONTRAST
TECHNIQUE: Multidetector CT imaging of the abdomen and pelvis was
performed following the standard protocol during bolus
administration of intravenous contrast.
Contrast: 110 ml 2mnipaque-1HH.

[Series 2: routine abdomen · axial · 0.83mm/px · z∈[-422,-62]mm · 7 of 96 slices shown, 12 images]
[im 12/96  soft-tissue]
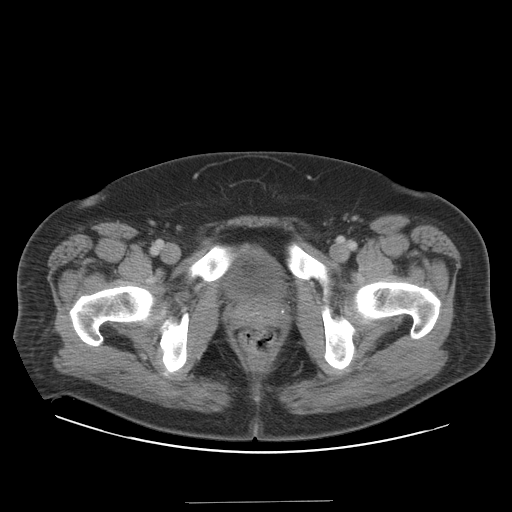
[im 12/96  bone]
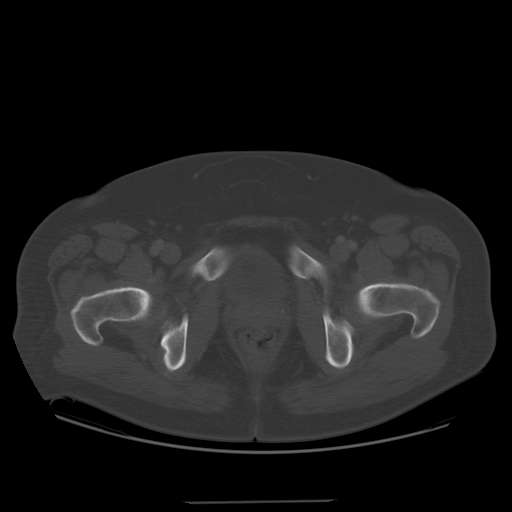
[im 24/96  soft-tissue]
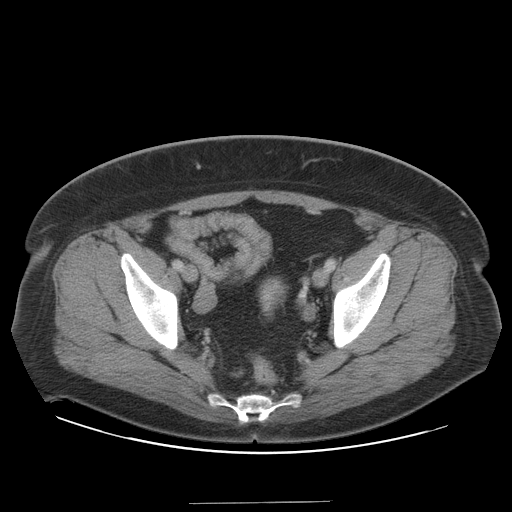
[im 36/96  soft-tissue]
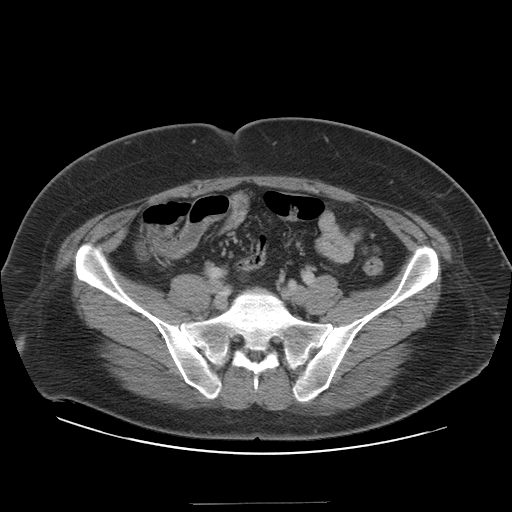
[im 48/96  soft-tissue]
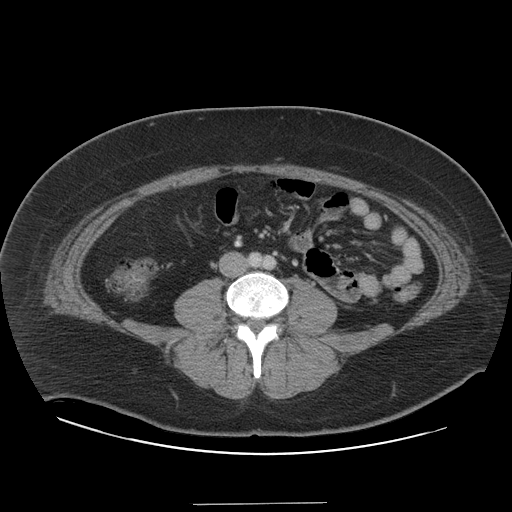
[im 48/96  lung]
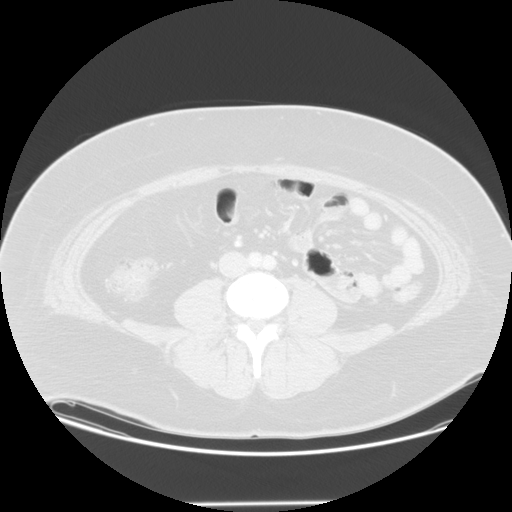
[im 60/96  soft-tissue]
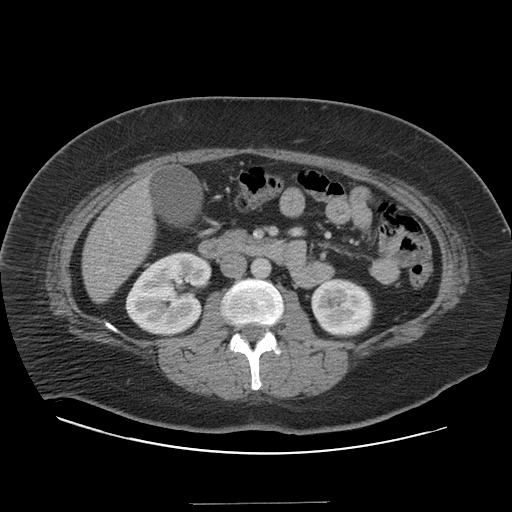
[im 60/96  lung]
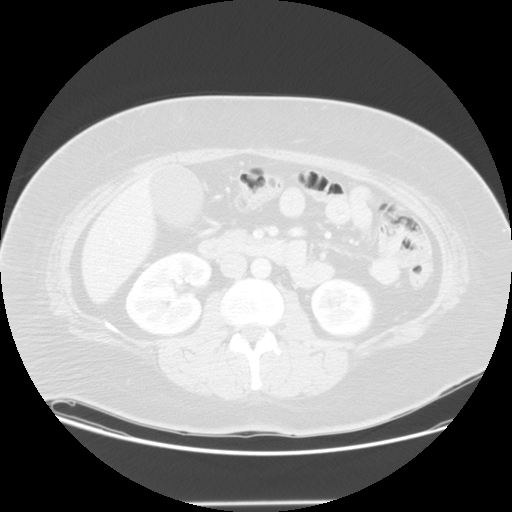
[im 72/96  soft-tissue]
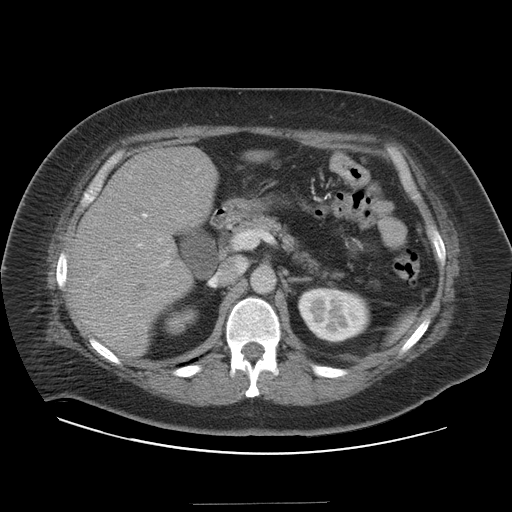
[im 72/96  lung]
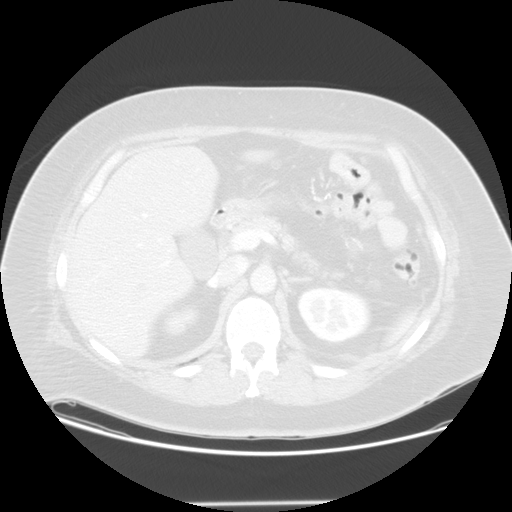
[im 84/96  soft-tissue]
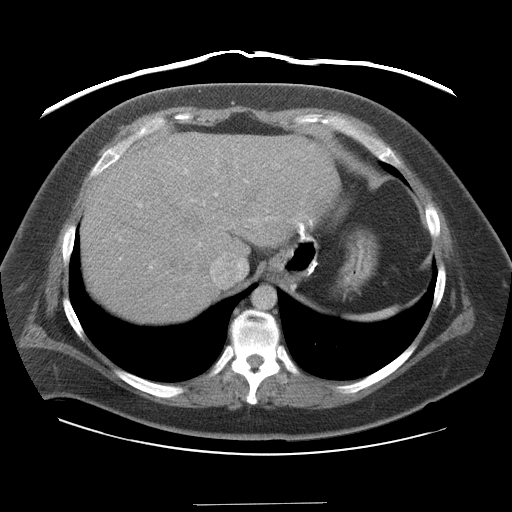
[im 84/96  lung]
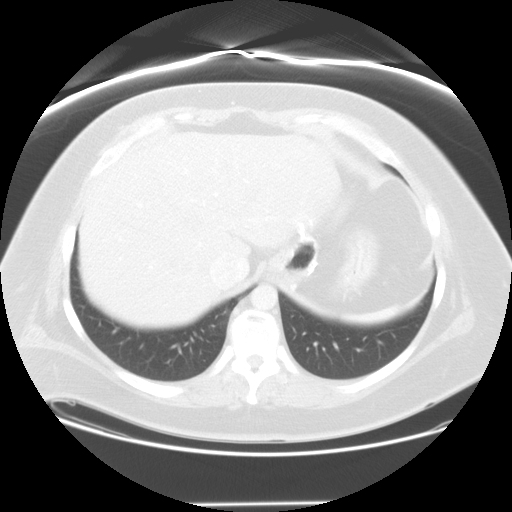

[Series 401: sag · sagittal · 0.96mm/px · 7 of 123 slices shown]
[im 12/123  soft-tissue]
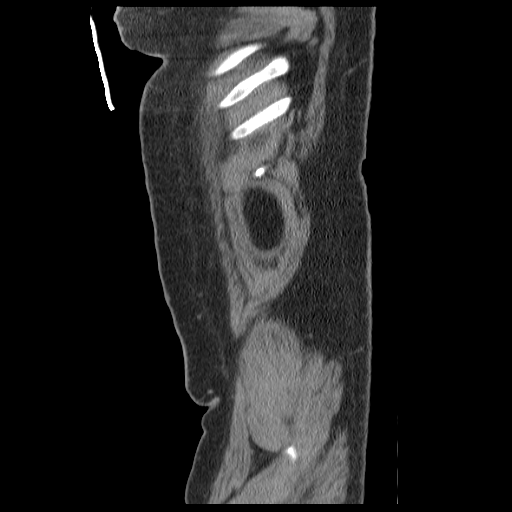
[im 23/123  soft-tissue]
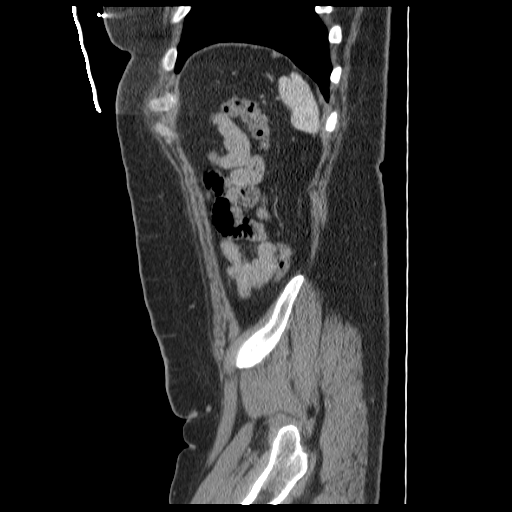
[im 45/123  soft-tissue]
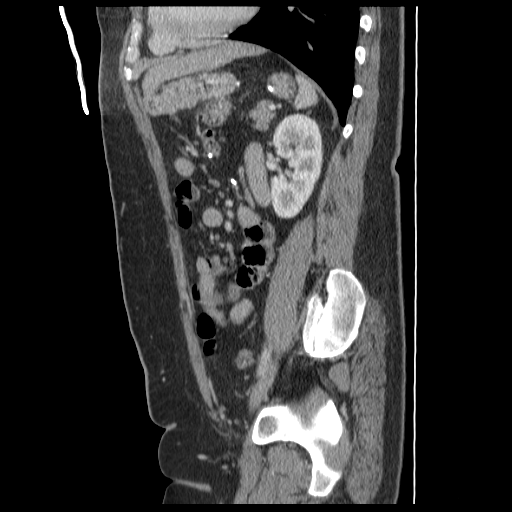
[im 56/123  soft-tissue]
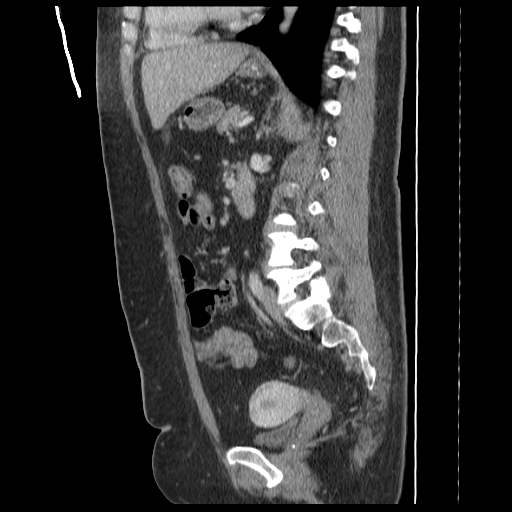
[im 67/123  soft-tissue]
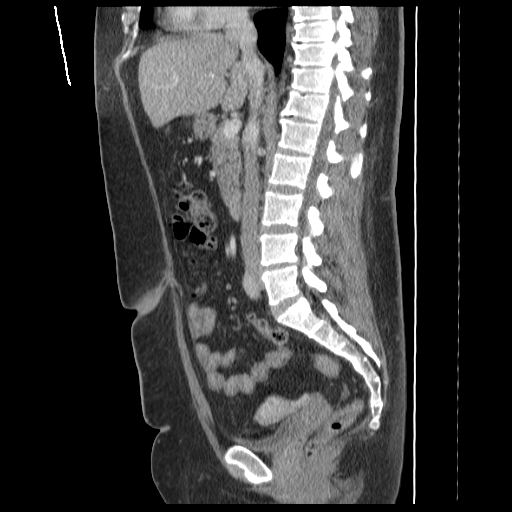
[im 78/123  soft-tissue]
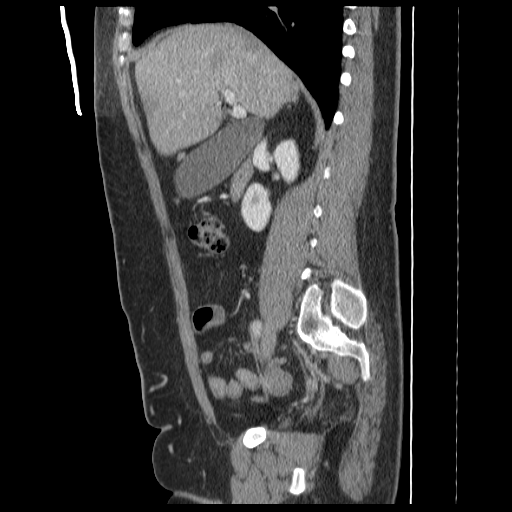
[im 100/123  soft-tissue]
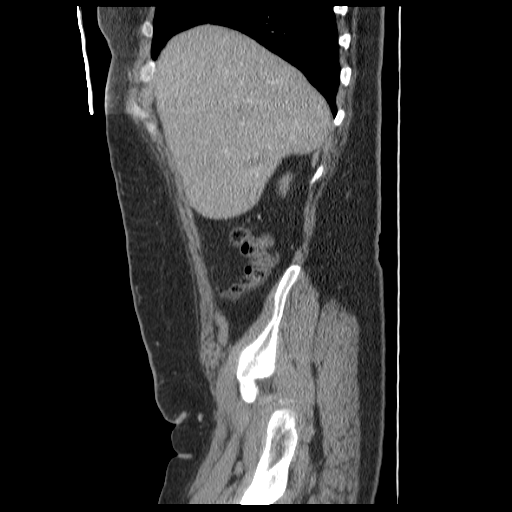

[14 of 32 positions shown; findings below may reference images not displayed]

FINDINGS: The lung bases are clear.  No pleural or pericardial
effusion.

Postoperative change of gastric bypass is again seen without
evidence of complication.  The appearance is unchanged.  The
gallbladder, liver, biliary tree, adrenal glands, spleen, pancreas
and kidneys all appear normal.  The uterus, adnexa and urinary
bladder all appear normal. No focal bony abnormality.
IMPRESSION: No acute finding. Unchanged examination.

## 2012-10-26 ENCOUNTER — Encounter (HOSPITAL_COMMUNITY): Payer: Self-pay | Admitting: *Deleted

## 2012-10-26 ENCOUNTER — Emergency Department (HOSPITAL_COMMUNITY)
Admission: EM | Admit: 2012-10-26 | Discharge: 2012-10-26 | Discharge status: Against Medical Advice | Payer: Self-pay | Attending: Emergency Medicine | Admitting: Emergency Medicine

## 2012-10-26 ENCOUNTER — Emergency Department (HOSPITAL_COMMUNITY): Payer: Self-pay

## 2012-10-26 ENCOUNTER — Observation Stay (HOSPITAL_COMMUNITY)
Admission: EM | Admit: 2012-10-26 | Discharge: 2012-10-27 | Disposition: A | Discharge status: Home / Self Care | Payer: Self-pay | Attending: Emergency Medicine | Admitting: Emergency Medicine

## 2012-10-26 DIAGNOSIS — F419 Anxiety disorder, unspecified: Secondary | ICD-10-CM

## 2012-10-26 DIAGNOSIS — Z7982 Long term (current) use of aspirin: Secondary | ICD-10-CM | POA: Insufficient documentation

## 2012-10-26 DIAGNOSIS — R079 Chest pain, unspecified: Secondary | ICD-10-CM | POA: Insufficient documentation

## 2012-10-26 DIAGNOSIS — F17200 Nicotine dependence, unspecified, uncomplicated: Secondary | ICD-10-CM | POA: Insufficient documentation

## 2012-10-26 DIAGNOSIS — R002 Palpitations: Secondary | ICD-10-CM

## 2012-10-26 DIAGNOSIS — F411 Generalized anxiety disorder: Secondary | ICD-10-CM | POA: Insufficient documentation

## 2012-10-26 DIAGNOSIS — R0602 Shortness of breath: Secondary | ICD-10-CM | POA: Insufficient documentation

## 2012-10-26 DIAGNOSIS — Z79899 Other long term (current) drug therapy: Secondary | ICD-10-CM | POA: Insufficient documentation

## 2012-10-26 LAB — CBC
Hemoglobin: 13 g/dL (ref 12.0–15.0)
MCH: 31.9 pg (ref 26.0–34.0)
MCV: 91.2 fL (ref 78.0–100.0)
RBC: 4.08 MIL/uL (ref 3.87–5.11)

## 2012-10-26 LAB — BASIC METABOLIC PANEL
CO2: 26 mEq/L (ref 19–32)
Calcium: 9.6 mg/dL (ref 8.4–10.5)
Creatinine, Ser: 0.67 mg/dL (ref 0.50–1.10)
Glucose, Bld: 113 mg/dL — ABNORMAL HIGH (ref 70–99)

## 2012-10-26 LAB — TROPONIN I: Troponin I: <0.3 ng/mL (ref <0.30)

## 2012-10-26 LAB — POCT I-STAT TROPONIN I: Troponin i, poc: 0.02 ng/mL (ref 0.00–0.08)

## 2012-10-26 LAB — POCT PREGNANCY, URINE: Preg Test, Ur: NEGATIVE

## 2012-10-26 MED ORDER — FAMOTIDINE 20 MG PO TABS: 20.0000 mg | ORAL_TABLET | Freq: Two times a day (BID) | ORAL | Status: DC

## 2012-10-26 MED ORDER — MORPHINE SULFATE 4 MG/ML IJ SOLN
4.0000 mg | Freq: Once | INTRAMUSCULAR | Status: AC
  Administered 2012-10-26: 4 mg via INTRAVENOUS
  Filled 2012-10-26: qty 1

## 2012-10-26 MED ORDER — ONDANSETRON HCL 4 MG/2ML IJ SOLN
4.0000 mg | Freq: Once | INTRAMUSCULAR | Status: AC
  Administered 2012-10-26: 4 mg via INTRAVENOUS
  Filled 2012-10-26: qty 2

## 2012-10-26 MED ORDER — GI COCKTAIL ~~LOC~~
30.0000 mL | Freq: Once | ORAL | Status: AC
  Administered 2012-10-26: 30 mL via ORAL
  Filled 2012-10-26: qty 30

## 2012-10-26 MED ORDER — FAMOTIDINE IN NACL 20-0.9 MG/50ML-% IV SOLN
20.0000 mg | Freq: Once | INTRAVENOUS | Status: DC
  Filled 2012-10-26: qty 50

## 2012-10-26 MED ORDER — ASPIRIN 81 MG PO CHEW
324.0000 mg | CHEWABLE_TABLET | Freq: Once | ORAL | Status: AC
  Administered 2012-10-26: 324 mg via ORAL
  Filled 2012-10-26: qty 4

## 2012-10-26 NOTE — Progress Notes (Signed)
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  Discussed discounted pharmacies, [facility] outpatient pharmacies, [place] self pay pcps DSS, health dept, [email or web address] and financial assistance programs in [employer].  Pt also seen by [facility] community Teacher, English as a foreign language and provided with alternative [place] pcps and health reform information.  Pt voiced understanding and appreciation of resources and services offered

## 2012-10-26 NOTE — ED Notes (Addendum)
Pt reports she has not been feeling well for the last week lightheaded, reports pressure on chest x3 days, reports she was in class today and "felt pressure on her chest" 9/10 that was worse. Has SOB. Pt reports she still feels pressure on her chest and that "she can't take in a deep breath".  Pt also reports itchy bumps/ hives on body the last few days. Pt took an allergy pill today.   Pt reports she left class and wen to the bathroom and pressure went away, went back to class and pressure came back. Pt tearful and crying.

## 2012-10-26 NOTE — Discharge Instructions (Signed)
Chest Pain (Nonspecific) It is often hard to give a specific diagnosis for the cause of chest pain. There is always a chance that your pain could be related to something serious, such as a heart attack or a blood clot in the lungs. You need to follow up with your caregiver for further evaluation. CAUSES   Heartburn.  Pneumonia or bronchitis.  Anxiety or stress.  Inflammation around your heart (pericarditis) or lung (pleuritis or pleurisy).  A blood clot in the lung.  A collapsed lung (pneumothorax). It can develop suddenly on its own (spontaneous pneumothorax) or from injury (trauma) to the chest.  Shingles infection (herpes zoster virus). The chest wall is composed of bones, muscles, and cartilage. Any of these can be the source of the pain.  The bones can be bruised by injury.  The muscles or cartilage can be strained by coughing or overwork.  The cartilage can be affected by inflammation and become sore (costochondritis). DIAGNOSIS  Lab tests or other studies, such as X-rays, electrocardiography, stress testing, or cardiac imaging, may be needed to find the cause of your pain.  TREATMENT   Treatment depends on what may be causing your chest pain. Treatment may include:  Acid blockers for heartburn.  Anti-inflammatory medicine.  Pain medicine for inflammatory conditions.  Antibiotics if an infection is present.  You may be advised to change lifestyle habits. This includes stopping smoking and avoiding alcohol, caffeine, and chocolate.  You may be advised to keep your head raised (elevated) when sleeping. This reduces the chance of acid going backward from your stomach into your esophagus.  Most of the time, nonspecific chest pain will improve within 2 to 3 days with rest and mild pain medicine. HOME CARE INSTRUCTIONS   If antibiotics were prescribed, take your antibiotics as directed. Finish them even if you start to feel better.  For the next few days, avoid physical  activities that bring on chest pain. Continue physical activities as directed.  Do not smoke.  Avoid drinking alcohol.  Only take over-the-counter or prescription medicine for pain, discomfort, or fever as directed by your caregiver.  Follow your caregiver's suggestions for further testing if your chest pain does not go away.  Keep any follow-up appointments you made. If you do not go to an appointment, you could develop lasting (chronic) problems with pain. If there is any problem keeping an appointment, you must call to reschedule. SEEK MEDICAL CARE IF:   You think you are having problems from the medicine you are taking. Read your medicine instructions carefully.  Your chest pain does not go away, even after treatment.  You develop a rash with blisters on your chest. SEEK IMMEDIATE MEDICAL CARE IF:   You have increased chest pain or pain that spreads to your arm, neck, jaw, back, or abdomen.  You develop shortness of breath, an increasing cough, or you are coughing up blood.  You have severe back or abdominal pain, feel nauseous, or vomit.  You develop severe weakness, fainting, or chills.  You have a fever. THIS IS AN EMERGENCY. Do not wait to see if the pain will go away. Get medical help at once. Call your local emergency services (911 in [place] Do not drive yourself to the hospital. MAKE SURE YOU:   Understand these instructions.  Will watch your condition.  Will get help right away if you are not doing well or get worse. Document Released: 09/10/2005 Document Revised: 02/23/2012 Document Reviewed: 07/06/2008 Exit[employer] Patient Information 2013 [employer].  

## 2012-10-26 NOTE — ED Provider Notes (Signed)
History    36yF with CP.Onset about a week ago. Intermittent pressure in upper chest. Does not radiate. No appreciable exacerbating or relieving factors. Today was in class and had same symptoms which were more severe than previous. Associated with sensation that couldn't take a deep breath. Currently feels better. No unusual leg pain or swelling. No hx of blood clot. No exogenous estrogen use. Smoker.  CSN: [id number]  Arrival date & time 10/26/12  1441   First MD Initiated Contact with Patient 10/26/12 1524      Chief Complaint  Patient presents with  . Chest Pain    (Consider location/radiation/quality/duration/timing/severity/associated sxs/prior treatment) HPI  Past Medical History  Diagnosis Date  . History of gastric bypass     Past Surgical History  Procedure Date  . Tonsillectomy   . Gastric bypass     Family History  Problem Relation Age of Onset  . Diabetes Father     History  Substance Use Topics  . Smoking status: Current Every Day Smoker  . Smokeless tobacco: Not on file  . Alcohol Use: Yes    OB History    Grav Para Term Preterm Abortions TAB SAB Ect Mult Living                  Review of Systems   Review of symptoms negative unless otherwise noted in HPI.   Allergies  Review of patient's allergies indicates no known allergies.  Home Medications   Current Outpatient Rx  Name  Route  Sig  Dispense  Refill  . ASPIRIN 325 MG PO TABS   Oral   Take 1,300 mg by mouth once.         Marland Kitchen LEVOTHYROXINE SODIUM 88 MCG PO TABS   Oral   Take 88 mcg by mouth daily.           BP 150/101  Pulse 61  Temp 98.4 F (36.9 C) (Oral)  Resp 16  SpO2 100%  Physical Exam  Nursing note and vitals reviewed. Constitutional: She is oriented to person, place, and time. She appears well-developed and well-nourished. No distress.  HENT:  Head: Normocephalic and atraumatic.  Eyes: Conjunctivae normal are normal. Right eye exhibits no discharge. Left eye  exhibits no discharge.  Neck: Neck supple.  Cardiovascular: Normal rate, regular rhythm and normal heart sounds.  Exam reveals no gallop and no friction rub.   No murmur heard. Pulmonary/Chest: Effort normal and breath sounds normal. No respiratory distress.  Abdominal: Soft. She exhibits no distension. There is no tenderness.  Musculoskeletal: She exhibits no edema and no tenderness.       Lower extremities symmetric as compared to each other. No calf tenderness. Negative Homan's. No palpable cords.   Neurological: She is alert and oriented to person, place, and time.  Skin: Skin is warm and dry.  Psychiatric: Her behavior is normal. Thought content normal.       Mildly anxious and tearful at times.    ED Course  Procedures (including critical care time)  Labs Reviewed  BASIC METABOLIC PANEL - Abnormal; Notable for the following:    Glucose, Bld 113 (*)     All other components within normal limits  CBC  POCT PREGNANCY, URINE  POCT I-STAT TROPONIN I  TROPONIN I  LAB REPORT - SCANNED   No results found.  EKG:  Rhythm: normal sinus Vent. rate 66 BPM PR interval 168 ms QRS duration 78 ms QT/QTc 372/390 ms Lead Reversal ST segments: NS ST  changes  1. Chest pain       MDM  36yF with CP. Suspect some anxiety component. Pt's description of pain somewhat concerning though. Pt relatively young but does have some risk factors including obesity and smoking. Consider PE, but doubt. Doubt infectious. Doubt dissection.  May potentially be esophageal spasm. Less likely GERD. Will transfer to [facility] under CP protocol. Pt's BMI is 36.9 and will have to have stress test.   Pt leaving AMA. Apparently husband got in a car wreck and she needs to leave. Return precautions discussed.         Raeford Razor, MD 10/28/12 [phone]
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
## 2012-10-27 DIAGNOSIS — R072 Precordial pain: Secondary | ICD-10-CM

## 2012-10-27 LAB — D-DIMER, QUANTITATIVE: D-Dimer, Quant: <0.27 ug/mL-FEU (ref 0.00–0.48)

## 2012-10-27 LAB — HEPATIC FUNCTION PANEL
AST: 15 U/L (ref 0–37)
Albumin: 4 g/dL (ref 3.5–5.2)
Bilirubin, Direct: 0.1 mg/dL (ref 0.0–0.3)
Total Bilirubin: 0.7 mg/dL (ref 0.3–1.2)

## 2012-10-27 LAB — LIPASE, BLOOD: Lipase: 29 U/L (ref 11–59)

## 2012-10-27 LAB — POCT I-STAT TROPONIN I: Troponin i, poc: 0 ng/mL (ref 0.00–0.08)

## 2012-10-27 MED ORDER — HYDROMORPHONE HCL PF 1 MG/ML IJ SOLN
1.0000 mg | Freq: Once | INTRAMUSCULAR | Status: AC
  Administered 2012-10-27: 1 mg via INTRAVENOUS
  Filled 2012-10-27: qty 1

## 2012-10-27 MED ORDER — LORAZEPAM 1 MG PO TABS: 1.0000 mg | ORAL_TABLET | Freq: Three times a day (TID) | ORAL | Status: DC | PRN

## 2012-10-27 MED ORDER — ONDANSETRON HCL 4 MG/2ML IJ SOLN
4.0000 mg | Freq: Once | INTRAMUSCULAR | Status: AC
  Administered 2012-10-27: 4 mg via INTRAVENOUS
  Filled 2012-10-27: qty 2

## 2012-10-27 MED ORDER — LORAZEPAM 1 MG PO TABS
1.0000 mg | ORAL_TABLET | Freq: Once | ORAL | Status: AC
Start: 2012-10-27 — End: 2012-10-27
  Administered 2012-10-27: 1 mg via ORAL
  Filled 2012-10-27: qty 1

## 2012-10-27 MED ORDER — LORAZEPAM 1 MG PO TABS
1.0000 mg | ORAL_TABLET | Freq: Once | ORAL | Status: AC
  Administered 2012-10-27: 1 mg via ORAL
  Filled 2012-10-27: qty 1

## 2012-10-27 MED ORDER — SODIUM CHLORIDE 0.9 % IV SOLN
INTRAVENOUS | Status: DC
  Administered 2012-10-27: 01:00:00 via INTRAVENOUS
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
## 2012-10-27 NOTE — ED Notes (Signed)
Report called to [facility] RN CDU

## 2012-10-27 NOTE — ED Provider Notes (Signed)
Mr. [name] is a 36 year old female, who is in the CDU on chest pain protocol. Patient was signed out to me by Dr. Dierdre Highman.  8:19 AM  Denies headache, chest pain, shortness of breath, nausea, vomiting, abdominal pain, constipation, diarrhea, dysuria, back pain, peripheral edema, numbness and tingling of the extremities.  PE: Gen: A&O x4 HEENT: PERRL, EOM CHEST: RRR, no m/r/g LUNGS: CTAB, no w/r/r ABD: BS x 4, ND/NT EXT: No edema, strong peripheral pulses NEURO: Sensation and strength intact bilaterally  Plan: Obtain stress echo, discharge if normal.  11:56 AM Received phone call from Dr. Elease Hashimoto, who tells me that [name] stress echo is negative.  12:05 PM Patient reevaluated, she states that she is feeling better. She believes that her symptoms are likely caused by anxiety, as her symptoms improved significantly after an Ativan last night. I'm going to discharge the patient with primary care followup, as well as a prescription for Ativan that she can take for anxiety. Patient is agreeable with this. She is stable and ready for discharge.  Roxy Horseman, PA-C 10/27/12 1205

## 2012-10-27 NOTE — ED Notes (Signed)
Rob, PA made aware of pt's return of pressure.

## 2012-10-27 NOTE — ED Notes (Signed)
Pt requesting something else for pain. Upon entering room, pt asking what it was she was given earlier yesterday along with the pain medication because that seemed to helped as well.

## 2012-10-27 NOTE — Progress Notes (Signed)
  Echocardiogram Echocardiogram Stress Test has been performed.  [name] 10/27/2012, 2:17 PM

## 2012-10-27 NOTE — ED Notes (Signed)
Informed patient that she can not have anything to drink due to Echo scheduled for the am.

## 2012-10-27 NOTE — ED Notes (Signed)
EDP into room 

## 2012-10-27 NOTE — ED Notes (Addendum)
Pt alert, NAD, calm, interactive, skin W&D, resps e/u, speaking in clear complete sentences. C/o epigastric CP, describes as pressure, also reports nausea, dizziness & sob (denies: shoulder pain, vd, fever, bleeding, urinary sx, vaginal sx, back pain, palpitations or numbness & tingling). Pain a little worse now. Has been constant and fluctuating all day. Some worsening with movement. Not effected by respirations or eating. Last ate at breakfast. Had some water and GI cocktail at [facility]. Took 4 - 325mg  ASA today. No relief with GI cocktail at [facility] earlier. Labs and imaging reviewed from earlier today. Labs added per protocol tonight. EKG from arrival tonight reviewed.

## 2012-10-27 NOTE — Progress Notes (Signed)
Utilization review completed.  

## 2012-10-27 NOTE — ED Notes (Signed)
EKG done

## 2012-10-27 NOTE — ED Notes (Signed)
Pt states she has had this left chest heaviness, squeezing for the last week.  Pt states she was hyperventilating and crying when the episode occurred today.  Pt states she had ativan earlier and it helped

## 2012-10-29 NOTE — ED Provider Notes (Signed)
Medical screening examination/treatment/procedure(s) were performed by non-physician practitioner and as supervising physician I was immediately available for consultation/collaboration.   Joya Gaskins, MD 10/29/12 [phone]

## 2012-11-25 ENCOUNTER — Emergency Department (INDEPENDENT_AMBULATORY_CARE_PROVIDER_SITE_OTHER)
Admission: EM | Admit: 2012-11-25 | Discharge: 2012-11-25 | Disposition: A | Discharge status: Home / Self Care | Payer: Self-pay | Source: Home / Self Care

## 2012-11-25 ENCOUNTER — Encounter (HOSPITAL_COMMUNITY): Payer: Self-pay

## 2012-11-25 DIAGNOSIS — F419 Anxiety disorder, unspecified: Secondary | ICD-10-CM

## 2012-11-25 DIAGNOSIS — F411 Generalized anxiety disorder: Secondary | ICD-10-CM

## 2012-11-25 HISTORY — DX: Disorder of thyroid, unspecified: E07.9

## 2012-11-25 MED ORDER — ALPRAZOLAM 0.5 MG PO TABS: 0.5000 mg | ORAL_TABLET | Freq: Three times a day (TID) | ORAL | Status: DC | PRN

## 2012-11-25 NOTE — ED Provider Notes (Signed)
History     CSN: [id number]  Arrival date & time 11/25/12  1745   First MD Initiated Contact with Patient 11/25/12 1844      Chief Complaint  Patient presents with  . Anxiety     HPI 36 year old oese famale who is a student she DTC she was from [place] comes in with anxiety symptoms for past 2 weeks. Patient informs feeling very anxious with some chest discomfort off and on. She was recently seen in the ED with chest pain symptoms and had a stress echo which was unremarkable. Lab work done at that time was unremarkable as well. Patient denies similar symptoms in the past however was on Synthroid for hypothyroidism that was prescribed by her physician in [place] 3 months back. She has not taken the meds for past one month that she have her prescriptions. She denies any headache, blurry vision, complains of occasional dizziness, has some chest pressure off and on, denies shortness of breath, abdominal pain, nausea, vomiting, palpitations, bowel or urinary symptoms. Denies any change in weight or appetite. Denies smoking or use of illicit drugs. Denies taking any over-the-counter medications.    Past Medical History  Diagnosis Date  . History of gastric bypass   . Thyroid disease     Past Surgical History  Procedure Date  . Tonsillectomy   . Gastric bypass     Family History  Problem Relation Age of Onset  . Diabetes Father     History  Substance Use Topics  . Smoking status: Current Every Day Smoker  . Smokeless tobacco: Not on file  . Alcohol Use: Yes    OB History    Grav Para Term Preterm Abortions TAB SAB Ect Mult Living                  Review of Systems As outlined in history of present illness Allergies  Review of patient's allergies indicates no known allergies.  Home Medications   Current Outpatient Rx  Name  Route  Sig  Dispense  Refill  . ASPIRIN 325 MG PO TABS   Oral   Take 1,300 mg by mouth daily.          Marland Kitchen FAMOTIDINE 20 MG PO TABS   Oral  Take 1 tablet (20 mg total) by mouth 2 (two) times daily.   60 tablet   0   . LEVOTHYROXINE SODIUM 88 MCG PO TABS   Oral   Take 88 mcg by mouth daily.         Marland Kitchen LORAZEPAM 1 MG PO TABS   Oral   Take 1 tablet (1 mg total) by mouth 3 (three) times daily as needed for anxiety.   15 tablet   0     BP 147/85  Pulse 73  Temp 98.4 F (36.9 C) (Oral)  Resp 19  SpO2 100%  Physical Exam Middle aged female in no acute distress HEENT: No pallor, no icterus, moist oral mucosa, no lymphadenopathy, neck supple, no thyromegaly Chest: Clear to this bilaterally no added sounds CVS: Normal S1 and S2, no murmurs rub or gallop Abdomen: Soft, nontender, nondistended, bowel sounds present Extremities: Warm, no edema CNS: AAO x3 nonfocal, no tremors ED Course  Procedures (including critical care time)  Labs Reviewed - No data to display No results found.   No diagnosis found.  Anxiety  Symptoms appear to be stress related. Recent blood work done was unremarkable. A recent stress echo done for chest pain symptoms was unremarkable  as well. Patient was taking Synthroid for 2 months which was prescribed by her physician in [place] and has stopped for past one month. She plans to see her physician in [place] when she visits there within 2 weeks and wants to get blood work there itself.  I will give her a short course of Xanax as needed for anxiety symptoms. Patient given educational resource to read  for prevention of anxiety and panic symptoms.   MDM  Patient isn't going back to [place] in 2 weeks and plans to follow up with her PCP over there.        Eddie North, MD 11/25/12 [phone]

## 2012-11-25 NOTE — ED Notes (Signed)
Patient states at times feels very anxious can not quite pin point the cause of why she is experiencing this

## 2013-01-02 ENCOUNTER — Other Ambulatory Visit: Payer: Self-pay

## 2013-01-02 ENCOUNTER — Emergency Department (HOSPITAL_COMMUNITY): Payer: Self-pay

## 2013-01-02 ENCOUNTER — Emergency Department (HOSPITAL_COMMUNITY)
Admission: EM | Admit: 2013-01-02 | Discharge: 2013-01-02 | Disposition: A | Discharge status: Home / Self Care | Payer: Self-pay | Attending: Emergency Medicine | Admitting: Emergency Medicine

## 2013-01-02 ENCOUNTER — Encounter (HOSPITAL_COMMUNITY): Payer: Self-pay | Admitting: Emergency Medicine

## 2013-01-02 DIAGNOSIS — Z79899 Other long term (current) drug therapy: Secondary | ICD-10-CM | POA: Insufficient documentation

## 2013-01-02 DIAGNOSIS — R4182 Altered mental status, unspecified: Secondary | ICD-10-CM | POA: Insufficient documentation

## 2013-01-02 DIAGNOSIS — F17200 Nicotine dependence, unspecified, uncomplicated: Secondary | ICD-10-CM | POA: Insufficient documentation

## 2013-01-02 DIAGNOSIS — T43214A Poisoning by selective serotonin and norepinephrine reuptake inhibitors, undetermined, initial encounter: Secondary | ICD-10-CM | POA: Insufficient documentation

## 2013-01-02 DIAGNOSIS — T43205A Adverse effect of unspecified antidepressants, initial encounter: Secondary | ICD-10-CM | POA: Insufficient documentation

## 2013-01-02 DIAGNOSIS — Z3202 Encounter for pregnancy test, result negative: Secondary | ICD-10-CM | POA: Insufficient documentation

## 2013-01-02 DIAGNOSIS — F1010 Alcohol abuse, uncomplicated: Secondary | ICD-10-CM | POA: Insufficient documentation

## 2013-01-02 DIAGNOSIS — Z9884 Bariatric surgery status: Secondary | ICD-10-CM | POA: Insufficient documentation

## 2013-01-02 DIAGNOSIS — F10929 Alcohol use, unspecified with intoxication, unspecified: Secondary | ICD-10-CM

## 2013-01-02 DIAGNOSIS — E079 Disorder of thyroid, unspecified: Secondary | ICD-10-CM | POA: Insufficient documentation

## 2013-01-02 LAB — CBC
HCT: 35.5 % — ABNORMAL LOW (ref 36.0–46.0)
Hemoglobin: 12.4 g/dL (ref 12.0–15.0)
MCH: 31.2 pg (ref 26.0–34.0)
MCV: 89.4 fL (ref 78.0–100.0)
RBC: 3.97 MIL/uL (ref 3.87–5.11)

## 2013-01-02 LAB — RAPID URINE DRUG SCREEN, HOSP PERFORMED: Amphetamines: NOT DETECTED

## 2013-01-02 LAB — ACETAMINOPHEN LEVEL: Acetaminophen (Tylenol), Serum: 39.7 ug/mL — ABNORMAL HIGH (ref 10–30)

## 2013-01-02 LAB — URINALYSIS, ROUTINE W REFLEX MICROSCOPIC
Glucose, UA: NEGATIVE mg/dL
Leukocytes, UA: NEGATIVE
Protein, ur: NEGATIVE mg/dL

## 2013-01-02 LAB — COMPREHENSIVE METABOLIC PANEL
ALT: 20 U/L (ref 0–35)
BUN: 8 mg/dL (ref 6–23)
CO2: 23 mEq/L (ref 19–32)
Calcium: 8.8 mg/dL (ref 8.4–10.5)
Creatinine, Ser: 0.78 mg/dL (ref 0.50–1.10)
GFR calc Af Amer: >90 mL/min (ref >90)
GFR calc non Af Amer: >90 mL/min (ref >90)
Glucose, Bld: 90 mg/dL (ref 70–99)

## 2013-01-02 LAB — SALICYLATE LEVEL: Salicylate Lvl: <2 mg/dL — ABNORMAL LOW (ref 2.8–20.0)

## 2013-01-02 LAB — ETHANOL: Alcohol, Ethyl (B): 195 mg/dL — ABNORMAL HIGH (ref 0–11)

## 2013-01-02 MED ORDER — SODIUM CHLORIDE 0.9 % IV BOLUS (SEPSIS)
1000.0000 mL | Freq: Once | INTRAVENOUS | Status: AC
  Administered 2013-01-02: 1000 mL via INTRAVENOUS

## 2013-01-02 NOTE — ED Notes (Signed)
Alert and oriented X4. Airway patent

## 2013-01-02 NOTE — ED Notes (Signed)
Pt was found by family on knees leaning against bed, drooling, Resp 8, pinpoint pupils. 2mg  Narcan IV given and pt was able to wake up and talk. Denies taking any medications aside from Trazadone and alcohol. Stated she just wanted to sleep. Denies SI/HI.

## 2013-01-02 NOTE — ED Notes (Signed)
Patient states that she has not intention of hurting herself or anyone else. Vital signs stable resp 28 and temp 97.9.
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
## 2013-01-02 NOTE — ED Notes (Signed)
[id number]<AV> Expected date:<BR> Expected time:<BR> Means of arrival:<BR> Comments:<BR>

## 2013-01-02 NOTE — ED Notes (Signed)
Pt informed that she would be placed in paper scrubs and wanded. Being changed now.

## 2013-03-25 ENCOUNTER — Emergency Department (HOSPITAL_COMMUNITY): Payer: Self-pay

## 2013-03-25 ENCOUNTER — Emergency Department (HOSPITAL_COMMUNITY)
Admission: EM | Admit: 2013-03-25 | Discharge: 2013-03-25 | Disposition: A | Discharge status: Home / Self Care | Payer: Self-pay | Attending: Emergency Medicine | Admitting: Emergency Medicine

## 2013-03-25 ENCOUNTER — Encounter (HOSPITAL_COMMUNITY): Payer: Self-pay

## 2013-03-25 DIAGNOSIS — Z79899 Other long term (current) drug therapy: Secondary | ICD-10-CM | POA: Insufficient documentation

## 2013-03-25 DIAGNOSIS — Z9884 Bariatric surgery status: Secondary | ICD-10-CM | POA: Insufficient documentation

## 2013-03-25 DIAGNOSIS — Z23 Encounter for immunization: Secondary | ICD-10-CM | POA: Insufficient documentation

## 2013-03-25 DIAGNOSIS — S81009A Unspecified open wound, unspecified knee, initial encounter: Secondary | ICD-10-CM | POA: Insufficient documentation

## 2013-03-25 DIAGNOSIS — S81811A Laceration without foreign body, right lower leg, initial encounter: Secondary | ICD-10-CM

## 2013-03-25 DIAGNOSIS — E079 Disorder of thyroid, unspecified: Secondary | ICD-10-CM | POA: Insufficient documentation

## 2013-03-25 DIAGNOSIS — F17200 Nicotine dependence, unspecified, uncomplicated: Secondary | ICD-10-CM | POA: Insufficient documentation

## 2013-03-25 MED ORDER — CEPHALEXIN 250 MG PO CAPS
500.0000 mg | ORAL_CAPSULE | Freq: Once | ORAL | Status: AC
  Administered 2013-03-25: 500 mg via ORAL
  Filled 2013-03-25: qty 2

## 2013-03-25 MED ORDER — HYDROCODONE-ACETAMINOPHEN 5-325 MG PO TABS: 1.0000 | ORAL_TABLET | Freq: Four times a day (QID) | ORAL | Status: DC | PRN

## 2013-03-25 MED ORDER — TETANUS-DIPHTH-ACELL PERTUSSIS 5-2.5-18.5 LF-MCG/0.5 IM SUSP
0.5000 mL | Freq: Once | INTRAMUSCULAR | Status: AC
  Administered 2013-03-25: 0.5 mL via INTRAMUSCULAR
  Filled 2013-03-25: qty 0.5

## 2013-03-25 MED ORDER — CEPHALEXIN 500 MG PO CAPS: 500.0000 mg | ORAL_CAPSULE | Freq: Four times a day (QID) | ORAL | Status: DC

## 2013-03-25 MED ORDER — HYDROCODONE-ACETAMINOPHEN 5-325 MG PO TABS
1.0000 | ORAL_TABLET | Freq: Once | ORAL | Status: AC
  Administered 2013-03-25: 1 via ORAL
  Filled 2013-03-25: qty 1
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
## 2013-03-25 NOTE — ED Notes (Signed)
Pt dc to home.  Pt states understanding to dc instructions.  Pt denies need for w/c.  Pt ambulatory to exit without difficulty.

## 2014-08-25 ENCOUNTER — Emergency Department (INDEPENDENT_AMBULATORY_CARE_PROVIDER_SITE_OTHER)
Admission: EM | Admit: 2014-08-25 | Discharge: 2014-08-25 | Disposition: A | Discharge status: Home / Self Care | Payer: Self-pay | Source: Home / Self Care

## 2014-08-25 ENCOUNTER — Encounter (HOSPITAL_COMMUNITY): Payer: Self-pay | Admitting: Emergency Medicine

## 2014-08-25 DIAGNOSIS — F419 Anxiety disorder, unspecified: Secondary | ICD-10-CM

## 2014-08-25 DIAGNOSIS — E039 Hypothyroidism, unspecified: Secondary | ICD-10-CM

## 2014-08-25 DIAGNOSIS — F411 Generalized anxiety disorder: Secondary | ICD-10-CM

## 2014-08-25 LAB — POCT I-STAT, CHEM 8
BUN: 10 mg/dL (ref 6–23)
CHLORIDE: 107 meq/L (ref 96–112)
CREATININE: 0.6 mg/dL (ref 0.50–1.10)
Calcium, Ion: 1.21 mmol/L (ref 1.12–1.23)
GLUCOSE: 168 mg/dL — AB (ref 70–99)
HCT: 37 % (ref 36.0–46.0)
Hemoglobin: 12.6 g/dL (ref 12.0–15.0)
POTASSIUM: 4.3 meq/L (ref 3.7–5.3)
Sodium: 138 mEq/L (ref 137–147)
TCO2: 21 mmol/L (ref 0–100)

## 2014-08-25 MED ORDER — VENLAFAXINE HCL ER 150 MG PO CP24: 150.0000 mg | ORAL_CAPSULE | Freq: Every day | ORAL | Status: DC

## 2014-08-25 MED ORDER — LORAZEPAM 0.5 MG PO TABS: 0.5000 mg | ORAL_TABLET | Freq: Three times a day (TID) | ORAL | Status: DC | PRN

## 2014-08-25 NOTE — ED Notes (Signed)
Pt  States  She      Is   Out of  Her  Ativan  And  effexor             She states  Is  Almost out  Of  Her  thyriod  Med       She  States  Her  Doctor  Is  In  [place]       -  She  States   She  Also wants  To be  Checked  For  Diabetes  And  Wants  Her  Cholesterol           Checked  As  Well       She  Reports  At times she  Gets  Some  Tightness in  Her  Chest  But  She   Attributed that  To  Anxiety

## 2014-08-25 NOTE — Discharge Instructions (Signed)
Hypothyroidism The thyroid is a large gland located in the lower front of your neck. The thyroid gland helps control metabolism. Metabolism is how your body handles food. It controls metabolism with the hormone thyroxine. When this gland is underactive (hypothyroid), it produces too little hormone.  CAUSES These include:   Absence or destruction of thyroid tissue.  Goiter due to iodine deficiency.  Goiter due to medications.  Congenital defects (since birth).  Problems with the pituitary. This causes a lack of TSH (thyroid stimulating hormone). This hormone tells the thyroid to turn out more hormone. SYMPTOMS  Lethargy (feeling as though you have no energy)  Cold intolerance  Weight gain (in spite of normal food intake)  Dry skin  Coarse hair  Menstrual irregularity (if severe, may lead to infertility)  Slowing of thought processes Cardiac problems are also caused by insufficient amounts of thyroid hormone. Hypothyroidism in the newborn is cretinism, and is an extreme form. It is important that this form be treated adequately and immediately or it will lead rapidly to retarded physical and mental development. DIAGNOSIS  To prove hypothyroidism, your caregiver may do blood tests and ultrasound tests. Sometimes the signs are hidden. It may be necessary for your caregiver to watch this illness with blood tests either before or after diagnosis and treatment. TREATMENT  Low levels of thyroid hormone are increased by using synthetic thyroid hormone. This is a safe, effective treatment. It usually takes about four weeks to gain the full effects of the medication. After you have the full effect of the medication, it will generally take another four weeks for problems to leave. Your caregiver may start you on low doses. If you have had heart problems the dose may be gradually increased. It is generally not an emergency to get rapidly to normal. HOME CARE INSTRUCTIONS   Take your  medications as your caregiver suggests. Let your caregiver know of any medications you are taking or start taking. Your caregiver will help you with dosage schedules.  As your condition improves, your dosage needs may increase. It will be necessary to have continuing blood tests as suggested by your caregiver.  Report all suspected medication side effects to your caregiver. SEEK MEDICAL CARE IF: Seek medical care if you develop:  Sweating.  Tremulousness (tremors).  Anxiety.  Rapid weight loss.  Heat intolerance.  Emotional swings.  Diarrhea.  Weakness. SEEK IMMEDIATE MEDICAL CARE IF:  You develop chest pain, an irregular heart beat (palpitations), or a rapid heart beat. MAKE SURE YOU:   Understand these instructions.  Will watch your condition.  Will get help right away if you are not doing well or get worse. Document Released: 12/01/2005 Document Revised: 02/23/2012 Document Reviewed: 07/21/2008 [employer] Patient Information 2015 [employer]. This information is not intended to replace advice given to you by your health care provider. Make sure you discuss any questions you have with your health care provider.  

## 2014-08-25 NOTE — ED Provider Notes (Signed)
CSN: [id number]     Arrival date & time 08/25/14  1609 History   First MD Initiated Contact with Patient 08/25/14 1625     Chief Complaint  Patient presents with  . 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 She has a history of hypothyroidism and anxiety. She has a primary care doctor in [place] and sees her for these conditions. She has developed recent anxiety she is associated with various somatic complaints. She does not feel well in general feels nervous, anxious heaviness in the chest. She denies history of CAD and she has felt this way before and presented to the [facility] emergency department and evaluated for chest pain in the past. This turned out to be negative for CAD. She would like to have a battery of labs to include lipids, thyroid studies ad refills.    Past Medical History  Diagnosis Date  . History of gastric bypass   . Thyroid disease    Past Surgical History  Procedure Laterality Date  . Tonsillectomy    . Gastric bypass     Family History  Problem Relation Age of Onset  . Diabetes Father    History  Substance Use Topics  . Smoking status: Current Every Day Smoker  . Smokeless tobacco: Not on file  . Alcohol Use: Yes   OB History   Grav Para Term Preterm Abortions TAB SAB Ect Mult Living                 Review of Systems  Constitutional: Positive for activity change. Negative for fever and fatigue.  HENT: Negative.   Respiratory: Negative for cough, choking, wheezing and stridor.        Shortness of breath when lying on her back.   Cardiovascular: Positive for chest pain and leg swelling. Negative for palpitations.  Gastrointestinal: Negative.   Genitourinary: Negative.   Musculoskeletal: Negative.   Skin: Negative.   Neurological: Negative for tremors, seizures, syncope, facial asymmetry,  speech difficulty, numbness and headaches.  Psychiatric/Behavioral: Positive for sleep disturbance. The patient is nervous/anxious.     Allergies  Review of patient's allergies indicates no known allergies.  Home Medications   Prior to Admission medications   Medication Sig Start Date End Date Taking? Authorizing Provider  cyanocobalamin (,VITAMIN B-12,) 1000 MCG/ML injection Inject 1,000 mcg into the muscle every 30 (thirty) days.    Historical Provider, MD  levothyroxine (SYNTHROID, LEVOTHROID) 88 MCG tablet Take 88 mcg by mouth daily.    Historical Provider, MD  LORazepam (ATIVAN) 0.5 MG tablet Take 1 tablet (0.5 mg total) by mouth every 8 (eight) hours as needed for anxiety. 08/25/14   Hayden Rasmussen, NP  traZODone (DESYREL) 50 MG tablet Take 50 mg by mouth at bedtime.    Historical Provider, MD  venlafaxine XR (EFFEXOR XR) 150 MG 24 hr capsule Take 1 capsule (150 mg total) by mouth daily with breakfast. 08/25/14   Hayden Rasmussen, NP  venlafaxine XR (EFFEXOR-XR) 150 MG 24 hr capsule Take 150 mg by mouth daily.    Historical Provider, MD   BP 102/67  Pulse 83  Temp(Src) 98.8 F (37.1 C) (Oral)  Resp 16  SpO2 100%  LMP 07/29/2014 Physical Exam  Nursing note and vitals reviewed. Constitutional: She is oriented to person, place, and time. She appears well-developed and well-nourished. No distress.  Eyes: Conjunctivae and EOM are normal. Pupils are equal, round, and reactive  to light.  Neck: Normal range of motion. Neck supple.  Cardiovascular: Normal rate, regular rhythm, normal heart sounds and intact distal pulses.   No murmur heard. Pulmonary/Chest: Effort normal and breath sounds normal. No respiratory distress. She has no wheezes. She has no rales.  Musculoskeletal: Normal range of motion. She exhibits no edema and no tenderness.  Neurological: She is alert and oriented to person, place, and time. She exhibits normal muscle tone.  Skin: Skin is warm and dry.  Psychiatric: Her speech is  normal. Judgment and thought content normal. Her mood appears anxious. Cognition and memory are normal.    ED Course  Procedures (including critical care time) Labs Review Labs Reviewed  POCT I-STAT, CHEM 8 - Abnormal; Notable for the following:    Glucose, Bld 168 (*)    All other components within normal limits   EKG. NSR. No ectopy. No ischemic changes. Imaging Review No results found. Results for orders placed during the hospital encounter of 08/25/14  POCT I-STAT, CHEM 8      Result Value Ref Range   Sodium 138  137 - 147 mEq/L   Potassium 4.3  3.7 - 5.3 mEq/L   Chloride 107  96 - 112 mEq/L   BUN 10  6 - 23 mg/dL   Creatinine, Ser 1.61  0.50 - 1.10 mg/dL   Glucose, Bld 096 (*) 70 - 99 mg/dL   Calcium, Ion 0.45  4.09 - 1.23 mmol/L   TCO2 21  0 - 100 mmol/L   Hemoglobin 12.6  12.0 - 15.0 g/dL   HCT 81.1  91.4 - 78.2 %     MDM   1. Anxiety   2. Hypothyroidism, unspecified hypothyroidism type     Ativan .5 mg #20 effexor xr 150 bid F/U with local PCP as discussed or see your PCPin [place] in 2 weeks.     Hayden Rasmussen, NP 08/25/14 1719  Hayden Rasmussen, NP 08/25/14 9562

## 2014-08-26 NOTE — ED Provider Notes (Signed)
Medical screening examination/treatment/procedure(s) were performed by resident physician or non-physician practitioner and as supervising physician I was immediately available for consultation/collaboration.   Ravon Mortellaro DOUGLAS MD.   Carmita Boom D Cherylee Rawlinson, MD 08/26/14 1708 

## 2014-09-02 ENCOUNTER — Emergency Department (INDEPENDENT_AMBULATORY_CARE_PROVIDER_SITE_OTHER)
Admission: EM | Admit: 2014-09-02 | Discharge: 2014-09-02 | Disposition: A | Discharge status: Home / Self Care | Payer: Self-pay | Source: Home / Self Care | Attending: Family Medicine | Admitting: Family Medicine

## 2014-09-02 ENCOUNTER — Encounter (HOSPITAL_COMMUNITY): Payer: Self-pay | Admitting: Emergency Medicine

## 2014-09-02 DIAGNOSIS — F419 Anxiety disorder, unspecified: Secondary | ICD-10-CM

## 2014-09-02 DIAGNOSIS — F411 Generalized anxiety disorder: Secondary | ICD-10-CM

## 2014-09-02 MED ORDER — VENLAFAXINE HCL 100 MG PO TABS: 100.0000 mg | ORAL_TABLET | Freq: Three times a day (TID) | ORAL | Status: DC

## 2014-09-02 MED ORDER — LORAZEPAM 0.5 MG PO TABS: 0.5000 mg | ORAL_TABLET | Freq: Two times a day (BID) | ORAL | Status: DC

## 2014-09-02 NOTE — ED Notes (Signed)
Out of medications

## 2014-09-02 NOTE — Discharge Instructions (Signed)
Thank you for coming in today. Restart Effexor 3 times daily Continue Ativan Followup with your primary care doctor in [place]  Generalized Anxiety Disorder Generalized anxiety disorder (GAD) is a mental disorder. It interferes with life functions, including relationships, work, and school. GAD is different from normal anxiety, which everyone experiences at some point in their lives in response to specific life events and activities. Normal anxiety actually helps [place] prepare for and get through these life events and activities. Normal anxiety goes away after the event or activity is over.  GAD causes anxiety that is not necessarily related to specific events or activities. It also causes excess anxiety in proportion to specific events or activities. The anxiety associated with GAD is also difficult to control. GAD can vary from mild to severe. People with severe GAD can have intense waves of anxiety with physical symptoms (panic attacks).  SYMPTOMS The anxiety and worry associated with GAD are difficult to control. This anxiety and worry are related to many life events and activities and also occur more days than not for 6 months or longer. People with GAD also have three or more of the following symptoms (one or more in children):  Restlessness.   Fatigue.  Difficulty concentrating.   Irritability.  Muscle tension.  Difficulty sleeping or unsatisfying sleep. DIAGNOSIS GAD is diagnosed through an assessment by your health care provider. Your health care provider will ask you questions aboutyour mood,physical symptoms, and events in your life. Your health care provider may ask you about your medical history and use of alcohol or drugs, including prescription medicines. Your health care provider may also do a physical exam and blood tests. Certain medical conditions and the use of certain substances can cause symptoms similar to those associated with GAD. Your health care provider may refer  you to a mental health specialist for further evaluation. TREATMENT The following therapies are usually used to treat GAD:   Medication. Antidepressant medication usually is prescribed for long-term daily control. Antianxiety medicines may be added in severe cases, especially when panic attacks occur.   Talk therapy (psychotherapy). Certain types of talk therapy can be helpful in treating GAD by providing support, education, and guidance. A form of talk therapy called cognitive behavioral therapy can teach you healthy ways to think about and react to daily life events and activities.  Stress managementtechniques. These include yoga, meditation, and exercise and can be very helpful when they are practiced regularly. A mental health specialist can help determine which treatment is best for you. Some people see improvement with one therapy. However, other people require a combination of therapies. Document Released: 03/28/2013 Document Revised: 04/17/2014 Document Reviewed: 03/28/2013 [facility] Patient Information 2015 [place], [place]. This information is not intended to replace advice given to you by your health care provider. Make sure you discuss any questions you have with your health care provider.

## 2014-09-02 NOTE — ED Provider Notes (Signed)
[name] is a 38 y.o. female who presents to Urgent Care today for anxiety. Patient is from [place]. She's been in [place] for several weeks and is scheduled to return in 5 days. She notes that she has run out of both of her Effexor and Ativan. She was unable to afford Effexor XR here because she does not have [place] [employer]. She notes worsening anxiety over the past week or so. Additionally she is out of Ativan. No seizures tremor fevers or chills.   Past Medical History  Diagnosis Date  . History of gastric bypass   . Thyroid disease    History  Substance Use Topics  . Smoking status: Current Every Day Smoker  . Smokeless tobacco: Not on file  . Alcohol Use: Yes   ROS as above Medications: No current facility-administered medications for this encounter.   Current Outpatient Prescriptions  Medication Sig Dispense Refill  . cyanocobalamin (,VITAMIN B-12,) 1000 MCG/ML injection Inject 1,000 mcg into the muscle every 30 (thirty) days.      Marland Kitchen levothyroxine (SYNTHROID, LEVOTHROID) 88 MCG tablet Take 88 mcg by mouth daily.      Marland Kitchen LORazepam (ATIVAN) 0.5 MG tablet Take 1 tablet (0.5 mg total) by mouth every 8 (eight) hours as needed for anxiety.  20 tablet  0  . LORazepam (ATIVAN) 0.5 MG tablet Take 1 tablet (0.5 mg total) by mouth 2 (two) times daily.  15 tablet  0  . traZODone (DESYREL) 50 MG tablet Take 50 mg by mouth at bedtime.      Marland Kitchen venlafaxine (EFFEXOR) 100 MG tablet Take 1 tablet (100 mg total) by mouth 3 (three) times daily.  30 tablet  0  . venlafaxine XR (EFFEXOR XR) 150 MG 24 hr capsule Take 1 capsule (150 mg total) by mouth daily with breakfast.  60 capsule  0  . [DISCONTINUED] famotidine (PEPCID) 20 MG tablet Take 1 tablet (20 mg total) by mouth 2 (two) times daily.  60 tablet  0    Exam:  BP 130/76  Pulse 80  Temp(Src) 98.2 F (36.8 C) (Oral)  Resp 18  SpO2 99%  LMP 07/29/2014 Gen: Well NAD morbidly obese HEENT: EOMI,  MMM Lungs: Normal  work of breathing. CTABL Heart: RRR no MRG Abd: NABS, Soft. Nondistended, Nontender Exts: Brisk capillary refill, warm and well perfused.  Neuropsych: No tremors. Normal affect alert and oriented normal thought process and speech patterns. No SI or HI expressed.  No results found for this or any previous visit (from the past 24 hour(s)). No results found.  Assessment and Plan: 38 y.o. female with anxiety contributing to withdrawal of Effexor and low-dose Ativan. Plan to provide regular Effexor 3 times daily which is much more affordable achieving her original dose. Additionally we'll prescribe a very limited amount of Ativan sufficient for followup in [place].  Discussed warning signs or symptoms. Please see discharge instructions. Patient expresses understanding.     Rodolph Bong, MD 09/02/14 [phone]

## 2014-11-20 ENCOUNTER — Encounter (HOSPITAL_COMMUNITY): Payer: Self-pay | Admitting: Emergency Medicine

## 2014-11-20 ENCOUNTER — Emergency Department (INDEPENDENT_AMBULATORY_CARE_PROVIDER_SITE_OTHER)
Admission: EM | Admit: 2014-11-20 | Discharge: 2014-11-20 | Disposition: A | Discharge status: Home / Self Care | Payer: Self-pay | Source: Home / Self Care | Attending: Family Medicine | Admitting: Family Medicine

## 2014-11-20 DIAGNOSIS — F419 Anxiety disorder, unspecified: Secondary | ICD-10-CM

## 2014-11-20 DIAGNOSIS — K047 Periapical abscess without sinus: Secondary | ICD-10-CM

## 2014-11-20 DIAGNOSIS — E039 Hypothyroidism, unspecified: Secondary | ICD-10-CM

## 2014-11-20 HISTORY — DX: Depression, unspecified: F32.A

## 2014-11-20 HISTORY — DX: Major depressive disorder, single episode, unspecified: F32.9

## 2014-11-20 MED ORDER — VENLAFAXINE HCL 100 MG PO TABS: 100.0000 mg | ORAL_TABLET | Freq: Three times a day (TID) | ORAL | Status: AC

## 2014-11-20 MED ORDER — LORAZEPAM 0.5 MG PO TABS: 0.5000 mg | ORAL_TABLET | Freq: Two times a day (BID) | ORAL | Status: AC

## 2014-11-20 MED ORDER — AMOXICILLIN 500 MG PO CAPS: 500.0000 mg | ORAL_CAPSULE | Freq: Two times a day (BID) | ORAL | Status: AC

## 2014-11-20 MED ORDER — FLUCONAZOLE 150 MG PO TABS: 150.0000 mg | ORAL_TABLET | Freq: Every day | ORAL | Status: AC
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
## 2014-11-20 NOTE — ED Notes (Signed)
Pt is from [place].  She states they increased her Synthroid medication in the last month and she is not sure if that is what is bothering her .  She is very anxious and can't seem to settle down.  She complains of chest tightness and a headache.  She also states she has a filling that fell out and she has had a fever and pain from this.  She has seen a dentist, but she cannot afford to have the tooth pulled here and is waiting to go back to [place] to have it taken care of.

## 2014-11-20 NOTE — Discharge Instructions (Signed)
You have a mild dental infection that will require antibiotics to clear Please take them to completion Please take iuprofen sparingly given your history of gastric bypass Please take 1000mg  of tylenol every 6-8 hours as needed for pain Please start the anxiety and depression medications as prescribed Please call your primary doctor in [place] today for a follow up appointment.  There is no evidence that your chest pain is related to your hear.t

## 2014-11-27 ENCOUNTER — Encounter (HOSPITAL_COMMUNITY): Payer: Self-pay | Admitting: Emergency Medicine

## 2014-11-27 ENCOUNTER — Emergency Department (INDEPENDENT_AMBULATORY_CARE_PROVIDER_SITE_OTHER)
Admission: EM | Admit: 2014-11-27 | Discharge: 2014-11-27 | Disposition: A | Discharge status: Home / Self Care | Payer: Self-pay | Source: Home / Self Care | Attending: Family Medicine | Admitting: Family Medicine

## 2014-11-27 DIAGNOSIS — F411 Generalized anxiety disorder: Secondary | ICD-10-CM

## 2014-11-27 NOTE — ED Provider Notes (Signed)
CSN: [id number]     Arrival date & time 11/27/14  1828 History   First MD Initiated Contact with Patient 11/27/14 1928     Chief Complaint  Patient presents with  . Thyroid Problem   (Consider location/radiation/quality/duration/timing/severity/associated sxs/prior Treatment) Patient is a 38 y.o. female presenting with thyroid problem. The history is provided by the patient.  Thyroid Problem This is a recurrent problem. The problem has not changed since onset.Pertinent negatives include no chest pain, no abdominal pain, no headaches and no shortness of breath. Associated symptoms comments: Anxiety attacks, recurrent for mos, treated here with ativan and effexor with no f/u. Pt reports going back to [place] for care.    Past Medical History  Diagnosis Date  . History of gastric bypass   . Thyroid disease   . Depression    Past Surgical History  Procedure Laterality Date  . Tonsillectomy    . Gastric bypass     Family History  Problem Relation Age of Onset  . Diabetes Father    History  Substance Use Topics  . Smoking status: Current Every Day Smoker -- 0.10 packs/day    Types: Cigarettes  . Smokeless tobacco: Never Used  . Alcohol Use: No   OB History    No data available     Review of Systems  Respiratory: Negative for shortness of breath.   Cardiovascular: Negative for chest pain.  Gastrointestinal: Negative for abdominal pain.  Neurological: Negative for headaches.  Psychiatric/Behavioral: Positive for sleep disturbance. Negative for suicidal ideas and self-injury. The patient is nervous/anxious.     Allergies  Review of patient's allergies indicates no known allergies.  Home Medications   Prior to Admission medications   Medication Sig Start Date End Date Taking? Authorizing Provider  amoxicillin (AMOXIL) 500 MG capsule Take 1 capsule (500 mg total) by mouth 2 (two) times daily. 11/20/14   Ozella Rocksavid J Merrell, MD  cyanocobalamin (,VITAMIN B-12,) 1000 MCG/ML  injection Inject 1,000 mcg into the muscle every 30 (thirty) days.    Historical Provider, MD  fluconazole (DIFLUCAN) 150 MG tablet Take 1 tablet (150 mg total) by mouth daily. Repeat dose in 3 days 11/20/14   Ozella Rocksavid J Merrell, MD  levothyroxine (SYNTHROID, LEVOTHROID) 88 MCG tablet Take 88 mcg by mouth daily.    Historical Provider, MD  LORazepam (ATIVAN) 0.5 MG tablet Take 1 tablet (0.5 mg total) by mouth 2 (two) times daily. 11/20/14   Ozella Rocksavid J Merrell, MD  traZODone (DESYREL) 50 MG tablet Take 50 mg by mouth at bedtime.    Historical Provider, MD  venlafaxine (EFFEXOR) 100 MG tablet Take 1 tablet (100 mg total) by mouth 3 (three) times daily. 11/20/14   Ozella Rocksavid J Merrell, MD   BP 169/98 mmHg  Pulse 70  Temp(Src) 98.7 F (37.1 C) (Oral)  Resp 18  SpO2 98%  LMP 11/19/2014 (Exact Date) Physical Exam  Constitutional: She is oriented to person, place, and time. She appears well-developed and well-nourished.  Neck: Normal range of motion. Neck supple. No thyromegaly present.  Cardiovascular: Normal rate, regular rhythm, normal heart sounds and intact distal pulses.   Pulmonary/Chest: Effort normal and breath sounds normal.  Neurological: She is alert and oriented to person, place, and time.  Skin: Skin is warm and dry.  Psychiatric: She has a normal mood and affect. Her behavior is normal. Judgment and thought content normal.  Nursing note and vitals reviewed.   ED Course  Procedures (including critical care time) Labs Review Labs Reviewed -  No data to display  Imaging Review No results found.   MDM   1. GAD (generalized anxiety disorder)        Linna HoffJames D Mavis Gravelle, MD 11/27/14 [phone]

## 2014-11-27 NOTE — Discharge Instructions (Signed)
Go to mental health center or [facility] for further assistance with anxiety.

## 2014-11-27 NOTE — ED Notes (Addendum)
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  Patient is from [place], goes to school in [place].  Patient reports being out of synthroid.  Reports 88 mcg makes her feel anxious.  So pcp dropped dose to 75 mcg and is out of all thyroid medicine.  Patient has run out of ativan.  Patient has been feeling anxious, patient thinks it is her anxiety

## 2015-01-15 DEATH — deceased
